# Patient Record
Sex: Male | Born: 1975 | Race: White | Hispanic: No | Marital: Married | Smoking: Current every day smoker
Health system: Southern US, Community
[De-identification: ages and names within clinical notes are randomized; demographics above are authoritative.]

## PROBLEM LIST (undated history)

## (undated) DIAGNOSIS — I10 Essential (primary) hypertension: Secondary | ICD-10-CM

## (undated) DIAGNOSIS — T7840XA Allergy, unspecified, initial encounter: Secondary | ICD-10-CM

## (undated) DIAGNOSIS — E785 Hyperlipidemia, unspecified: Secondary | ICD-10-CM

## (undated) DIAGNOSIS — R945 Abnormal results of liver function studies: Secondary | ICD-10-CM

## (undated) DIAGNOSIS — R7989 Other specified abnormal findings of blood chemistry: Secondary | ICD-10-CM

## (undated) HISTORY — DX: Hemochromatosis, unspecified: E83.119

## (undated) HISTORY — DX: Other specified abnormal findings of blood chemistry: R79.89

## (undated) HISTORY — DX: Hyperlipidemia, unspecified: E78.5

## (undated) HISTORY — DX: Essential (primary) hypertension: I10

## (undated) HISTORY — DX: Allergy, unspecified, initial encounter: T78.40XA

## (undated) HISTORY — DX: Abnormal results of liver function studies: R94.5

---

## 2006-12-14 ENCOUNTER — Ambulatory Visit: Payer: Self-pay | Admitting: Internal Medicine

## 2006-12-14 LAB — CONVERTED CEMR LAB
Albumin: 4.6 g/dL (ref 3.5–5.2)
Alkaline Phosphatase: 44 units/L (ref 39–117)
BUN: 12 mg/dL (ref 6–23)
Basophils Absolute: 0.1 10*3/uL (ref 0.0–0.1)
Bilirubin Urine: NEGATIVE
Calcium: 10.1 mg/dL (ref 8.4–10.5)
Creatinine, Ser: 0.9 mg/dL (ref 0.4–1.5)
Direct LDL: 182 mg/dL
HDL: 39.4 mg/dL (ref >39.0)
Hemoglobin, Urine: NEGATIVE
Hemoglobin: 16.1 g/dL (ref 13.0–17.0)
Ketones, ur: NEGATIVE mg/dL
LDL Cholesterol: 177 mg/dL — ABNORMAL HIGH (ref 0–99)
MCHC: 34.8 g/dL (ref 30.0–36.0)
Monocytes Absolute: 0.9 10*3/uL — ABNORMAL HIGH (ref 0.2–0.7)
Monocytes Relative: 10.5 % (ref 3.0–11.0)
Platelets: 301 10*3/uL (ref 150–400)
Potassium: 4.7 meq/L (ref 3.5–5.1)
RBC: 4.81 M/uL (ref 4.22–5.81)
RDW: 11.9 % (ref 11.5–14.6)
TSH: 0.91 microintl units/mL (ref 0.35–5.50)
Total Bilirubin: 1.1 mg/dL (ref 0.3–1.2)
Total CHOL/HDL Ratio: 5.9
Total Protein, Urine: NEGATIVE mg/dL
Triglycerides: 80 mg/dL (ref 0–149)
Urine Glucose: NEGATIVE mg/dL
VLDL: 16 mg/dL (ref 0–40)
pH: 7 (ref 5.0–8.0)

## 2006-12-20 ENCOUNTER — Ambulatory Visit: Payer: Self-pay | Admitting: Internal Medicine

## 2007-02-05 ENCOUNTER — Ambulatory Visit: Payer: Self-pay | Admitting: Internal Medicine

## 2007-02-05 LAB — CONVERTED CEMR LAB
ALT: 45 units/L (ref 0–53)
HDL: 42.7 mg/dL (ref >39.0)
Triglycerides: 120 mg/dL (ref 0–149)

## 2007-02-11 ENCOUNTER — Ambulatory Visit: Payer: Self-pay | Admitting: Internal Medicine

## 2007-04-05 ENCOUNTER — Ambulatory Visit: Payer: Self-pay | Admitting: Internal Medicine

## 2007-04-05 LAB — CONVERTED CEMR LAB
Cholesterol: 192 mg/dL (ref 0–200)
HDL: 46.2 mg/dL (ref >39.0)
LDL Cholesterol: 138 mg/dL — ABNORMAL HIGH (ref 0–99)
Total CHOL/HDL Ratio: 4.2

## 2007-04-12 ENCOUNTER — Ambulatory Visit: Payer: Self-pay | Admitting: Internal Medicine

## 2007-04-12 DIAGNOSIS — E785 Hyperlipidemia, unspecified: Secondary | ICD-10-CM | POA: Insufficient documentation

## 2007-04-12 DIAGNOSIS — I10 Essential (primary) hypertension: Secondary | ICD-10-CM | POA: Insufficient documentation

## 2007-10-15 ENCOUNTER — Ambulatory Visit: Payer: Self-pay | Admitting: Internal Medicine

## 2007-10-15 LAB — CONVERTED CEMR LAB
ALT: 23 units/L (ref 0–53)
AST: 29 units/L (ref 0–37)
LDL Cholesterol: 113 mg/dL — ABNORMAL HIGH (ref 0–99)
VLDL: 21 mg/dL (ref 0–40)

## 2007-10-18 ENCOUNTER — Ambulatory Visit: Payer: Self-pay | Admitting: Internal Medicine

## 2007-12-02 ENCOUNTER — Ambulatory Visit: Payer: Self-pay | Admitting: Internal Medicine

## 2008-03-10 ENCOUNTER — Ambulatory Visit: Payer: Self-pay | Admitting: Internal Medicine

## 2008-06-22 ENCOUNTER — Telehealth (INDEPENDENT_AMBULATORY_CARE_PROVIDER_SITE_OTHER): Payer: Self-pay | Admitting: *Deleted

## 2008-06-23 ENCOUNTER — Ambulatory Visit: Payer: Self-pay | Admitting: Radiology

## 2008-06-23 ENCOUNTER — Ambulatory Visit: Payer: Self-pay | Admitting: Internal Medicine

## 2008-06-23 ENCOUNTER — Ambulatory Visit (HOSPITAL_BASED_OUTPATIENT_CLINIC_OR_DEPARTMENT_OTHER): Admission: RE | Admit: 2008-06-23 | Discharge: 2008-06-23 | Payer: Self-pay | Admitting: Internal Medicine

## 2008-07-23 ENCOUNTER — Ambulatory Visit: Payer: Self-pay | Admitting: Internal Medicine

## 2008-07-23 LAB — CONVERTED CEMR LAB
Total CHOL/HDL Ratio: 5
VLDL: 14.8 mg/dL (ref 0.0–40.0)

## 2008-07-29 ENCOUNTER — Ambulatory Visit: Payer: Self-pay | Admitting: Internal Medicine

## 2008-09-10 ENCOUNTER — Ambulatory Visit: Payer: Self-pay | Admitting: Internal Medicine

## 2008-09-10 LAB — CONVERTED CEMR LAB
AST: 33 units/L (ref 0–37)
BUN: 9 mg/dL (ref 6–23)
Cholesterol: 166 mg/dL (ref 0–200)
GFR calc non Af Amer: 104.37 mL/min (ref >60)
HDL: 46 mg/dL (ref >39.00)
Potassium: 4.8 meq/L (ref 3.5–5.1)
Sodium: 140 meq/L (ref 135–145)
VLDL: 11 mg/dL (ref 0.0–40.0)

## 2008-09-14 ENCOUNTER — Ambulatory Visit: Payer: Self-pay | Admitting: Internal Medicine

## 2009-03-24 ENCOUNTER — Telehealth: Payer: Self-pay | Admitting: Internal Medicine

## 2009-08-02 ENCOUNTER — Ambulatory Visit: Payer: Self-pay | Admitting: Internal Medicine

## 2009-08-02 LAB — CONVERTED CEMR LAB
ALT: 25 units/L (ref 0–53)
AST: 33 units/L (ref 0–37)
Bilirubin, Direct: 0.1 mg/dL (ref 0.0–0.3)
CO2: 30 meq/L (ref 19–32)
Chloride: 103 meq/L (ref 96–112)
Creatinine, Ser: 0.9 mg/dL (ref 0.4–1.5)
Potassium: 4.8 meq/L (ref 3.5–5.1)
Total CHOL/HDL Ratio: 4
Total Protein: 7.5 g/dL (ref 6.0–8.3)
Triglycerides: 99 mg/dL (ref 0.0–149.0)

## 2009-08-05 ENCOUNTER — Ambulatory Visit: Payer: Self-pay | Admitting: Internal Medicine

## 2009-11-23 ENCOUNTER — Telehealth: Payer: Self-pay | Admitting: Internal Medicine

## 2009-11-24 ENCOUNTER — Ambulatory Visit: Payer: Self-pay | Admitting: Family

## 2010-05-25 NOTE — Progress Notes (Signed)
Summary: Head Congestion  Phone Note Call from Patient Call back at Home Phone [phone]   Caller: Patient Call For: D. Thomos Lemons DO Summary of Call: patient called and left voice message stating he has been nursing a sinus infection for the past few days and he is not any better. His message states that he has head congestion. He has taken over the counter medication with no relief. He states he will be leaving to go out of town and is requesting a antibiotic.   call was returned to patient, he was informed that Dr Artist Pais does not prescribe antibiotics without being seen. He states that he does not think that he will be able to get in to see Dr Artist Pais today, and he will be leaving to go out of town in the morning. He aske that I check with Dr Artist Pais to see what he advises Initial call taken by: Glendell Docker CMA,  November 23, 2009 9:14 AM  Follow-up for Phone Call        sorry, we can not prescribe abx over the phone Follow-up by: D. Thomos Lemons DO,  November 23, 2009 12:04 PM  Additional Follow-up for Phone Call Additional follow up Details #1::        call returned to patient, he was advised per Dr Artist Pais instructions. He has scheduled a follow up for 8/3 with Sandford Craze Additional Follow-up by: Glendell Docker CMA,  November 23, 2009 2:50 PM

## 2010-05-25 NOTE — Assessment & Plan Note (Signed)
Summary: SINUS INFECTION/DK--Rm 4   Vital Signs:  Patient profile:   35 year old male Height:      71 inches Weight:      161.75 pounds BMI:     22.64 O2 Sat:      99 % on Room air Temp:     98.3 degrees F 0 Pulse rate:   98 / minute Pulse rhythm:   regular Resp:     16 per minute BP sitting:   130 / 82  (right arm) Cuff size:   regular  Vitals Entered By: Mervin Kung CMA Duncan Dull) (November 24, 2009 9:43 AM)  O2 Flow:  Room air CC: Room 4   Sinus drainage, sneezing and head congestion x 4 days.  Pt also states that his left ear doesn't feel right since beach trip in May. Is Patient Diabetic? No   Primary Care Provider:  Dondra Spry DO  CC:  Room 4   Sinus drainage and sneezing and head congestion x 4 days.  Pt also states that his left ear doesn't feel right since beach trip in May..  History of Present Illness: Dave Rodriguez is a 35 year old male who presents with complaint of sinus drainage since sunday (4 days ago)  Notes that he has had yellow nasal discharge.  Denies pain with eye movement.  Denies fever.  + Frontal sinus pressure.  Energy level is good.  Denies cough.  Notes that his ears feel "clogged up"   Notes that he got some water in his left ear in May while at the beach, and "hasn't been the same since."  Allergies (verified): No Known Drug Allergies  Past History:  Past Medical History: Last updated: 08/05/2009 Hyperlipidemia Family history of CAD    Hypertension   Family History: Last updated: 08/05/2009 Family History of CAD Male 1st degree relative <50 Family History Hypertension Maternal grandmother was diagnosed with colon cancer in her mid 28s.    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 Former Smoker  Alcohol use-yes        Risk Factors: Alcohol Use: 2 (07/29/2008) Caffeine Use: 4 beverages daily (08/05/2009) Exercise: no (08/05/2009)  Risk Factors: Smoking Status: current  (08/05/2009) Packs/Day: <0.25 (07/29/2008)  Physical Exam  General:  Well-developed,well-nourished,in no acute distress; alert,appropriate and cooperative throughout examination Head:  Normocephalic and atraumatic without obvious abnormalities. No apparent alopecia or balding. Ears:  R TM normal.  L TM partially occluded by cerumen.  Area of TM that is visible appears pink Lungs:  Normal respiratory effort, chest expands symmetrically. Lungs are clear to auscultation, no crackles or wheezes. Heart:  Normal rate and regular rhythm. S1 and S2 normal without gallop, murmur, click, rub or other extra sounds.   Impression & Recommendations:  Problem # 1:  SINUSITIS (ICD-473.9) Assessment New Will treat with amoxicillin x 10 days.   Pt was instructed as noted in pt instructions.  Patient was also counseled on smoking cessation. His updated medication list for this problem includes:    Amoxicillin 500 Mg Caps (Amoxicillin) ..... One tablet by mouth three times a day x 10  Problem # 2:  OTITIS MEDIA, LEFT (ICD-382.9) Assessment: New Suspect mild left OM.  Amoxicillin as for #1. His updated medication list for this problem includes:    Amoxicillin 500 Mg Caps (Amoxicillin) ..... One tablet by mouth three times a day x 10  Complete Medication List: 1)  Hydrochlorothiazide  12.5 Mg Tabs (Hydrochlorothiazide) .... One by mouth qd 2)  Simvastatin 40 Mg Tabs (Simvastatin) .... One by mouth qpm 3)  Amitriptyline Hcl 25 Mg Tabs (Amitriptyline hcl) .... 1/2 to 1 tab by mouth at bedtime prn 4)  Amlodipine Besylate 2.5 Mg Tabs (Amlodipine besylate) .... One by mouth once daily 5)  Amoxicillin 500 Mg Caps (Amoxicillin) .... One tablet by mouth three times a day x 10  Other Orders: Tobacco use cessation intermediate 3-10 minutes ([address]08)  Patient Instructions: 1)  Call if your symptoms worsen or do not improve. Prescriptions: AMOXICILLIN 500 MG CAPS (AMOXICILLIN) one tablet by mouth three times a day  x 10  #30 x 0   Entered and Authorized by:   Lemont Fillers FNP   Signed by:   Lemont Fillers FNP on 11/24/2009   Method used:   Electronically to        [facility]. [address] [phone]* (retail)       [address]       [place], [place]  [address]       Ph: [phone] or [phone]       Fax: [phone]   RxID:   [id number]   Current Allergies (reviewed today): No known allergies   Prevention & Chronic Care Immunizations   Influenza vaccine: Not documented    Tetanus booster: 07/29/2008: Tdap    Pneumococcal vaccine: Not documented  Other Screening   Smoking status: current  (08/05/2009)  Lipids   Total Cholesterol: 191  (08/02/2009)   LDL: 123  (08/02/2009)   LDL Direct: 172.5  (07/23/2008)   HDL: 48.60  (08/02/2009)   Triglycerides: 99.0  (08/02/2009)    SGOT (AST): 33  (08/02/2009)   SGPT (ALT): 25  (08/02/2009)   Alkaline phosphatase: 47  (08/02/2009)   Total bilirubin: 0.8  (08/02/2009)  Hypertension   Last Blood Pressure: 130 / 82  (11/24/2009)   Serum creatinine: 0.9  (08/02/2009)   Serum potassium 4.8  (08/02/2009)  Self-Management Support :    Hypertension self-management support: Not documented    Lipid self-management support: Not documented

## 2010-05-25 NOTE — Assessment & Plan Note (Signed)
Summary: 6 MONTHS ROV-CH [facility] WITH PT/MHF, rescheduled- jr   Vital Signs:  Patient profile:   35 year old male Height:      71 inches Weight:      164 pounds BMI:     22.96 O2 Sat:      97 % on Room air Temp:     98.8 degrees F oral Pulse rate:   81 / minute Pulse rhythm:   regular Resp:     16 per minute BP sitting:   130 / 94  (right arm) Cuff size:   98large  Vitals Entered By: Glendell Docker CMA (August 05, 2009 2:08 PM)  O2 Flow:  Room air CC: Rm 2- 6 Month Follow up disease management   Primary Care Provider:  DThomos Lemons DO  CC:  Rm 2- 6 Month Follow up disease management.  History of Present Illness:  Hypertension Follow-Up      This is a 35 year old man who presents for Hypertension follow-up.  The patient denies lightheadedness, headaches, and edema.  The patient denies the following associated symptoms: chest pain.  Compliance with medications (by patient report) has been near 100%.  The patient reports that dietary compliance has been fair.  The patient reports exercising occasionally.    hyperlipidemia - tolerating meds.  good compliance  He plans on moving to [place], [place].  still working for [employer]  Alcohol-Tobacco     Smoking Status: current  Caffeine-Diet-Exercise     Caffeine use/day: 4 beverages daily     Does Patient Exercise: no  Allergies (verified): No Known Drug Allergies  Past History:  Past Medical History: Hyperlipidemia Family history of CAD    Hypertension   Family History: Family History of CAD Male 1st degree relative <50 Family History Hypertension Maternal grandmother was diagnosed with colon cancer in her mid 19s.    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 Former Smoker  Alcohol use-yes      Caffeine use/day:  4 beverages daily  Physical Exam  General:  alert, well-developed, and well-nourished.   Neck:  No deformities, masses, or  tenderness noted.no carotid bruits.   Lungs:  normal respiratory effort, normal breath sounds, no crackles, and no wheezes.   Heart:  Normal rate and regular rhythm. S1 and S2 normal without gallop, murmur, click, rub or other extra sounds.   Impression & Recommendations:  Problem # 1:  HYPERTENSION, BORDERLINE (ICD-401.9) diastolic bp is suboptimal.  add low dose amlodipine.  pt to monitor for lower ext edema  His updated medication list for this problem includes:    Hydrochlorothiazide 12.5 Mg Tabs (Hydrochlorothiazide) ..... One by mouth qd    Amlodipine Besylate 2.5 Mg Tabs (Amlodipine besylate) ..... One by mouth once daily  BP today: 130/94 Prior BP: 130/80 (09/14/2008)  Labs Reviewed: K+: 4.8 (08/02/2009) Creat: : 0.9 (08/02/2009)   Chol: 191 (08/02/2009)   HDL: 48.60 (08/02/2009)   LDL: 123 (08/02/2009)   TG: 99.0 (08/02/2009)  Problem # 2:  DYSLIPIDEMIA (ICD-272.4) Assessment: Unchanged stable.  Maintain current medication regimen.  His updated medication list for this problem includes:    Simvastatin 40 Mg Tabs (Simvastatin) ..... One by mouth qpm  Labs Reviewed: SGOT: 33 (08/02/2009)   SGPT: 25 (08/02/2009)   HDL:48.60 (08/02/2009), 46.00 (09/10/2008)  LDL:123 (08/02/2009), 109 (09/10/2008)  Chol:191 (08/02/2009), 166 (09/10/2008)  Trig:99.0 (08/02/2009), 55.0 (09/10/2008)  Complete Medication List: 1)  Hydrochlorothiazide 12.5  Mg Tabs (Hydrochlorothiazide) .... One by mouth qd 2)  Simvastatin 40 Mg Tabs (Simvastatin) .... One by mouth qpm 3)  Amitriptyline Hcl 25 Mg Tabs (Amitriptyline hcl) .... 1/2 to 1 tab by mouth at bedtime prn 4)  Amlodipine Besylate 2.5 Mg Tabs (Amlodipine besylate) .... One by mouth once daily Prescriptions: SIMVASTATIN 40 MG TABS (SIMVASTATIN) one by mouth qpm  #90 x 3   Entered and Authorized by:   D. Thomos Lemons DO   Signed by:   D. Thomos Lemons DO on 08/05/2009   Method used:   Electronically to        [facility]. [address] [phone]*  (retail)       [address]       [place], [place]  [address]       Ph: [phone] or [phone]       Fax: [phone]   RxID:   [id number] HYDROCHLOROTHIAZIDE 12.5 MG TABS (HYDROCHLOROTHIAZIDE) one by mouth qd  #90 x 3   Entered and Authorized by:   D. Thomos Lemons DO   Signed by:   D. Thomos Lemons DO on 08/05/2009   Method used:   Electronically to        [facility]. [address] [phone]* (retail)       [address]       [place], [place]  [address]       Ph: [phone] or [phone]       Fax: [phone]   RxID:   [id number] AMLODIPINE BESYLATE 2.5 MG TABS (AMLODIPINE BESYLATE) one by mouth once daily  #30 x 5   Entered and Authorized by:   D. Thomos Lemons DO   Signed by:   D. Thomos Lemons DO on 08/05/2009   Method used:   Electronically to        [facility]. [address] [phone]* (retail)       [address]       [place], [place]  [address]       Ph: [phone] or [phone]       Fax: [phone]   RxID:   [id number]     Current Allergies (reviewed today): No known allergies

## 2010-06-21 ENCOUNTER — Encounter (INDEPENDENT_AMBULATORY_CARE_PROVIDER_SITE_OTHER): Payer: Self-pay | Admitting: *Deleted

## 2010-06-21 ENCOUNTER — Other Ambulatory Visit: Payer: Self-pay | Admitting: Internal Medicine

## 2010-06-21 ENCOUNTER — Other Ambulatory Visit: Payer: 59

## 2010-06-21 DIAGNOSIS — E785 Hyperlipidemia, unspecified: Secondary | ICD-10-CM

## 2010-06-21 DIAGNOSIS — Z0000 Encounter for general adult medical examination without abnormal findings: Secondary | ICD-10-CM

## 2010-06-21 DIAGNOSIS — Z0389 Encounter for observation for other suspected diseases and conditions ruled out: Secondary | ICD-10-CM

## 2010-06-21 LAB — CBC WITH DIFFERENTIAL/PLATELET
Basophils Relative: 1 % (ref 0.0–3.0)
Eosinophils Absolute: 0.2 10*3/uL (ref 0.0–0.7)
Eosinophils Relative: 4.8 % (ref 0.0–5.0)
HCT: 45.5 % (ref 39.0–52.0)
Hemoglobin: 16.1 g/dL (ref 13.0–17.0)
Lymphs Abs: 1.4 10*3/uL (ref 0.7–4.0)
MCHC: 35.4 g/dL (ref 30.0–36.0)
MCV: 99.8 fl (ref 78.0–100.0)
Monocytes Absolute: 0.5 10*3/uL (ref 0.1–1.0)
Neutro Abs: 2.6 10*3/uL (ref 1.4–7.7)
RBC: 4.55 Mil/uL (ref 4.22–5.81)
WBC: 4.7 10*3/uL (ref 4.5–10.5)

## 2010-06-21 LAB — BASIC METABOLIC PANEL
CO2: 29 mEq/L (ref 19–32)
Chloride: 102 mEq/L (ref 96–112)
Creatinine, Ser: 0.7 mg/dL (ref 0.4–1.5)
Potassium: 5.4 mEq/L — ABNORMAL HIGH (ref 3.5–5.1)
Sodium: 139 mEq/L (ref 135–145)

## 2010-06-21 LAB — URINALYSIS
Bilirubin Urine: NEGATIVE
Ketones, ur: NEGATIVE
Leukocytes, UA: NEGATIVE
Nitrite: NEGATIVE
Specific Gravity, Urine: 1.01 (ref 1.000–1.030)
Total Protein, Urine: NEGATIVE
pH: 6.5 (ref 5.0–8.0)

## 2010-06-21 LAB — LDL CHOLESTEROL, DIRECT: Direct LDL: 145.4 mg/dL

## 2010-06-21 LAB — LIPID PANEL
Cholesterol: 204 mg/dL — ABNORMAL HIGH (ref 0–200)
Triglycerides: 46 mg/dL (ref 0.0–149.0)

## 2010-06-21 LAB — HEPATIC FUNCTION PANEL
ALT: 25 U/L (ref 0–53)
Albumin: 4.9 g/dL (ref 3.5–5.2)
Total Protein: 7.3 g/dL (ref 6.0–8.3)

## 2010-06-28 ENCOUNTER — Encounter: Payer: Self-pay | Admitting: Internal Medicine

## 2010-06-28 ENCOUNTER — Encounter (INDEPENDENT_AMBULATORY_CARE_PROVIDER_SITE_OTHER): Payer: 59 | Admitting: Internal Medicine

## 2010-06-28 DIAGNOSIS — L538 Other specified erythematous conditions: Secondary | ICD-10-CM | POA: Insufficient documentation

## 2010-06-28 DIAGNOSIS — Z0000 Encounter for general adult medical examination without abnormal findings: Secondary | ICD-10-CM

## 2010-06-28 LAB — CONVERTED CEMR LAB

## 2010-07-05 NOTE — Assessment & Plan Note (Signed)
Summary: CPE/MHF   Vital Signs:  Patient profile:   35 year old male Height:      71 inches Weight:      167 pounds BMI:     23.38 O2 Sat:      100 % on Room air Temp:     98.5 degrees F oral Pulse rate:   74 / minute Resp:     18 per minute BP sitting:   132 / 84  (right arm) Cuff size:   regular  Vitals Entered By: Glendell Docker CMA (June 28, 2010 8:20 AM)  O2 Flow:  Room air  Primary Care Provider:  D. Thomos Lemons DO   History of Present Illness: 35 year old white male for routine physical He has history of mild hypertension and hyperlipidemia Patient previously prescribed simvastatin for strong family history of coronary artery disease CRP has been elevated in the past  It has been several months since patient has taken any of his medications He noticed mild ED symptoms when we added amlodipine to hydrochlorothiazide Patient has been more compliant with healthier diet His blood pressure has been fairly normal at home he also notes less stress at home  He states fairly active He denies unusual chest pain or shortness of breath  we reviewed his recent blood work He has mildly elevated AST He takes Advil occasionally for headaches Episodic alcohol use  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 to the [place] office but company decided not to relocate him  Son is now 58 years old, but will be 2 in June Stable home life  Preventive Screening-Counseling & Management  Alcohol-Tobacco     Alcohol drinks/day: <1     Smoking Status: current  Caffeine-Diet-Exercise     Caffeine use/day: 3 beverages daily     Does Patient Exercise: no  Allergies (verified): No Known Drug Allergies  Past History:  Past Medical History: Hyperlipidemia Family history of CAD    Hypertension      Family History: Family History of CAD Male 1st degree relative <50 Family History Hypertension Maternal grandmother was diagnosed with colon  cancer in her mid 64s.     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         no hernias, c/o rash on top of buttocks, mild pruritus,  assoc with sweating  Physical Exam  General:  alert, well-developed, and well-nourished.   Head:  normocephalic and atraumatic.   Neck:  No deformities, masses, or tenderness noted. Lungs:  normal respiratory effort and normal breath sounds.   Heart:  normal rate, regular rhythm, and no gallop.   Abdomen:  soft, non-tender, normal bowel sounds, no masses, no hepatomegaly, and no splenomegaly.   Extremities:  No lower extremity edema Neurologic:  cranial nerves II-XII intact and gait normal.   Skin:  1 cm oval shaped hyperpigmented lesion middle of back Psych:  normally interactive, good eye contact, not anxious appearing, and not depressed appearing.     Impression & Recommendations:  Problem # 1:  HEALTH MAINTENANCE EXAM (ICD-V70.0) Reviewed adult health maintenance protocols. Pt counseled on diet and exercise. decrease alcohol use  Td Booster: Tdap (07/29/2008)  Flu Vax: Declined (06/28/2010)   Chol: 204 (06/21/2010)   HDL: 50.90 (06/21/2010)   LDL: 123 (08/02/2009)   TG: 46.0 (06/21/2010) TSH: 0.97 (06/21/2010)   PSA: 0.46 (06/21/2010)  Problem # 2:  HYPERTENSION, BORDERLINE (ICD-401.9) Assessment: Improved  Pt stopped both meds  BP has been fairly stable amlodipine caused mild ED encouraged regular exercise program   The following medications were removed from the medication list:    Hydrochlorothiazide 12.5 Mg Tabs (Hydrochlorothiazide) ..... One by mouth qd    Amlodipine Besylate 2.5 Mg Tabs (Amlodipine besylate) ..... One by mouth once daily  BP  today: 132/84 Prior BP: 130/82 (11/24/2009)  Labs Reviewed: K+: 5.4 (06/21/2010) Creat: : 0.7 (06/21/2010)   Chol: 204 (06/21/2010)   HDL: 50.90 (06/21/2010)   LDL: 123 (08/02/2009)   TG: 46.0 (06/21/2010)  Problem # 3:  INTERTRIGO, CANDIDAL (ICD-695.89) pt with erythematous patch top of buttocks near crease probable candial intertrigo use lotrisone as directed keep area dry  Complete Medication List: 1)  Simvastatin 10 Mg Tabs (Simvastatin) .... One by mouth qpm 2)  Clotrimazole-betamethasone 1-0.05 % Crea (Clotrimazole-betamethasone) .... Apply once daily x 2 weeks as directed  Patient Instructions: 1)  Please schedule a follow-up appointment in 1 year 2)  BMP prior to visit, ICD-9:  272.4 3)  Hepatic Panel prior to visit, ICD-9: 272.4 4)  Lipid Panel prior to visit, ICD-9: 272.4 5)  High sensitivity CRP :  272.4 6)  Please return for lab work one (1) week before your next appointment.  7)  Please follow up with your dermatologist (Dr. Mayford Knife) to have skin lesion on back biopsied Prescriptions: CLOTRIMAZOLE-BETAMETHASONE 1-0.05 % CREA (CLOTRIMAZOLE-BETAMETHASONE) apply once daily x 2 weeks as directed  #15 grams x 0   Entered and Authorized by:   D. Thomos Lemons DO   Signed by:   D. Thomos Lemons DO on 06/28/2010   Method used:   Electronically to        [facility]. [address] [phone]* (retail)       [address]       [place], [place]  [address]       Ph: [phone] or [phone]       Fax: [phone]   RxID:   [id number] SIMVASTATIN 10 MG TABS (SIMVASTATIN) one by mouth qpm  #90 x 1   Entered and Authorized by:   D. Thomos Lemons DO   Signed by:   D. Thomos Lemons DO on 06/28/2010   Method used:   Electronically to        [facility]. [address] [phone]* (retail)       [address]       [place], [place]  [address]       Ph: [phone] or [phone]       Fax: [phone]   RxID:   [id number]    Orders Added: 1)  Est.  Patient 18-39 years [[id number]]   Immunization History:  Influenza Immunization History:    Influenza:  declined (06/28/2010)   Contraindications/Deferment of Procedures/Staging:    Test/Procedure: PSA    Reason for deferment: patient declined   Immunization History:  Influenza Immunization History:    Influenza:  Declined (06/28/2010)   Current Allergies (reviewed today): No known allergies

## 2010-09-06 NOTE — Assessment & Plan Note (Signed)
[facility]                           PRIMARY CARE OFFICE NOTE   NAME:Dave Rodriguez, Dave Rodriguez                        MRN:          [id number]  DATE:12/20/2006                            DOB:          18-Dec-1975    CHIEF COMPLAINT:  New patient to practice.   HISTORY OF PRESENT ILLNESS:  Patient is a 35 year old white male here to  establish primary care.  He previously lived in [place] but  returned two years ago.  He has been fairly healthy for most of his  life.  He notes history of allergic rhinitis.  He was seen by a [facility]  allergist 6 to 8 years ago.  He received allergy shots for 2 or 3 years.  His allergy symptoms have significantly improved since then.  He does  have a history of mild wheezing when he is exposed to cat dander.   CURRENT MEDICATIONS:  None.   ALLERGY TO MEDICATIONS:  None.   SOCIAL HISTORY:  Patient has been married for the last 6 years.  He has  a 24-month-old son.  He works in Airline pilot for [employer].   FAMILY HISTORY:  1. Mother and father are both 60.  2. Father had his first MI at age 72, noted to have high blood      pressure and high cholesterol.  3. Mother is fairly healthy.  4. Maternal grandfather has mild diabetes.  He is 49 years old.  5. Maternal grandmother was diagnosed with colon cancer in her mid      36's.   HABITS:  He averages 10-14 drinks per week.  He is a social drinker.  He  smokes also socially 2 to 3 cigarettes per day.   LABORATORY DATA:  Patient had screening labs including CBC notable for  an H&H of 16.1 and 46.4, basic metabolic profile showed glucose  minimally elevated at 102, BUN 12, serum creatinine at 0.9, LFTs were  unremarkable.  Lipid panel showed a total cholesterol of 232,  triglycerides 80, HDL 177, LDL of 182, TSH was 0.91 and UA was normal.   REVIEW OF SYSTEMS:  Occasional nasal congestion.  He takes over-the-  counter Claritin as needed.  Denies any history of  palpitations, chest  pain, dyspnea on exertion.  He does not exercise on a regular basis.  Denies heartburn, nausea, vomiting, constipation, diarrhea.  No dark  stools or blood in his stool.  All other systems negative.   PHYSICAL EXAM:  VITALS:  Height is 5 feet 11 inches, weight is 183.8  pounds, temperature is 98.6, pulse is 70, BP is 150/90 in the left arm  in the seated position.  GENERAL:  The patient is a pleasant, well-developed, well-nourished 31-  year-old white male in no apparent distress.  HEENT:  Normocephalic, atraumatic.  Pupils were equal and reactive to  light bilaterally.  Extraocular motility was intact.  Patient was  anicteric, conjunctiva was within normal limits.  OROPHARYNGEAL EXAM:  Benign.  Normal dentition.  NECK:  Supple.  No adenopathy, carotid bruit or thyromegaly.  CHEST EXAM:  Normal  inspiratory effort.  Chest is clear to auscultation  bilaterally, no rhonchi, rales or wheezing.  CARDIOVASCULAR:  Regular rate and rhythm.  No significant murmurs, rubs  or gallops appreciated.  ABDOMEN:  Soft, nontender, positive bowel sounds.  No organomegaly.  MUSCULOSKELETAL EXAM:  No clubbing, cyanosis or edema.  Patient has  palpable pedis, dorsalis and tibialis posterior pulses.  NEUROLOGIC:  Cranial nerve II through XII was grossly intact.  He was  nonfocal.  SKIN EXAM:  The patient had a small plaque-like eruption over his right  hand and also his upper chest and abdomen and a 1 cm nevus on his back.   A baseline EKG in the office showed normal sinus rhythm at 52 bpm, no ST  changes and minimal voltage criteria for LVH, may be normal variant.   IMPRESSION/RECOMMENDATIONS:  Routine physical in 35 year old white male  with strong family history of coronary disease.   Patient hesitant to take statin type medication.  We discussed low  cholesterol diet.  He is to monitor his blood pressure at home and  decrease his salt intake.  We discussed the importance of  aerobic  exercise with a formula for target heart rate.  We will repeat his lipid  panel, check a C-reactive protein and Apo B in approximately 6 weeks.  Patient will try triamcinolone cream 0.1% b.i.d. for probable eczema.  This could be pityriasis rosea.   Lastly, patient will schedule a separate appointment for punch biopsy of  dysplastic nevus.   Follow up in 7 to 8 weeks.     Barbette Hair. Artist Pais, DO  Electronically Signed    RDY/MedQ  DD: 12/20/2006  DT: 12/21/2006  Job #: [id number]

## 2011-11-10 ENCOUNTER — Other Ambulatory Visit (INDEPENDENT_AMBULATORY_CARE_PROVIDER_SITE_OTHER): Payer: 59

## 2011-11-10 DIAGNOSIS — Z0000 Encounter for general adult medical examination without abnormal findings: Secondary | ICD-10-CM

## 2011-11-10 LAB — HEPATIC FUNCTION PANEL
ALT: 40 U/L (ref 0–53)
AST: 39 U/L — ABNORMAL HIGH (ref 0–37)
Albumin: 4.6 g/dL (ref 3.5–5.2)
Alkaline Phosphatase: 44 U/L (ref 39–117)
Bilirubin, Direct: 0 mg/dL (ref 0.0–0.3)
Total Protein: 7 g/dL (ref 6.0–8.3)

## 2011-11-10 LAB — CBC WITH DIFFERENTIAL/PLATELET
Basophils Relative: 0.7 % (ref 0.0–3.0)
Eosinophils Relative: 2.5 % (ref 0.0–5.0)
Hemoglobin: 15.5 g/dL (ref 13.0–17.0)
Lymphocytes Relative: 28.3 % (ref 12.0–46.0)
MCHC: 34.4 g/dL (ref 30.0–36.0)
Monocytes Relative: 11.1 % (ref 3.0–12.0)
Neutro Abs: 3.6 10*3/uL (ref 1.4–7.7)
Neutrophils Relative %: 57.4 % (ref 43.0–77.0)
RBC: 4.52 Mil/uL (ref 4.22–5.81)
WBC: 6.3 10*3/uL (ref 4.5–10.5)

## 2011-11-10 LAB — TSH: TSH: 1.31 u[IU]/mL (ref 0.35–5.50)

## 2011-11-10 LAB — POCT URINALYSIS DIPSTICK
Bilirubin, UA: NEGATIVE
Glucose, UA: NEGATIVE
Ketones, UA: NEGATIVE
Leukocytes, UA: NEGATIVE
Nitrite, UA: NEGATIVE
pH, UA: 7.5

## 2011-11-10 LAB — BASIC METABOLIC PANEL
CO2: 27 mEq/L (ref 19–32)
Calcium: 9.4 mg/dL (ref 8.4–10.5)
Creatinine, Ser: 0.9 mg/dL (ref 0.4–1.5)
Sodium: 140 mEq/L (ref 135–145)

## 2011-11-10 LAB — LIPID PANEL
HDL: 47.4 mg/dL (ref >39.00)
Total CHOL/HDL Ratio: 4

## 2011-11-14 ENCOUNTER — Ambulatory Visit (INDEPENDENT_AMBULATORY_CARE_PROVIDER_SITE_OTHER): Payer: 59 | Admitting: Internal Medicine

## 2011-11-14 ENCOUNTER — Encounter: Payer: Self-pay | Admitting: *Deleted

## 2011-11-14 VITALS — BP 132/84 | HR 82 | Temp 98.1°F | Resp 16 | Ht 71.0 in | Wt 167.0 lb

## 2011-11-14 DIAGNOSIS — Z0000 Encounter for general adult medical examination without abnormal findings: Secondary | ICD-10-CM | POA: Insufficient documentation

## 2011-11-14 NOTE — Progress Notes (Signed)
  Subjective:    Patient ID: Dave Rodriguez, male    DOB: 12-23-1975, 36 y.o.   MRN: [id number]  HPI  36 year old white male with history of dyslipidemia and borderline hypertension for routine physical. He denies any significant interval medical history. He's been doing fairly well. At last visit he reported he might get transferred to [facility] office. However this did not occur and he is still living in [place]. Unfortunately he went through a divorce 1 year ago.  We reviewed his recent fasting lipid profile. His results reflect patient being off of statins   Review of Systems   Constitutional: Negative for activity change, appetite change and unexpected weight change.  Eyes: Negative for visual disturbance.  Respiratory: Negative for cough, chest tightness and shortness of breath.   Cardiovascular: Negative for chest pain.  Genitourinary: Negative for difficulty urinating.  Neurological: Negative for headaches.  Gastrointestinal: Negative for abdominal pain, heartburn melena or hematochezia Psych: Negative for depression or anxiety  Past Medical History  Diagnosis Date  . Hyperlipidemia   . Hypertension     History   Social History  . Marital Status: Divorced    Spouse Name: N/A    Number of Children: N/A  . Years of Education: N/A   Occupational History  . Not on file.   Social History Main Topics  . Smoking status: Former Games developer  . Smokeless tobacco: Not on file  . Alcohol Use: Yes  . Drug Use: No  . Sexually Active: Not on file   Other Topics Concern  . Not on file   Social History Narrative  . No narrative on file    No past surgical history on file.  Family History  Problem Relation Age of Onset  . Coronary artery disease    . Hypertension    . Cancer Maternal Grandmother     Allergies not on file  No current outpatient prescriptions on file prior to visit.    BP 132/84  Pulse 82  Temp 98.1 F (36.7 C)  Resp 16  Ht 5\' 11"  (1.803 m)   Wt 167 lb (75.751 kg)  BMI 23.29 kg/m2     Objective:   Physical Exam  Constitutional: He is oriented to person, place, and time. He appears well-developed and well-nourished. No distress.  HENT:  Head: Normocephalic and atraumatic.  Right Ear: External ear normal.  Left Ear: External ear normal.  Mouth/Throat: Oropharynx is clear and moist.  Eyes: EOM are normal. No scleral icterus.  Neck: Neck supple.  Cardiovascular: Normal rate, regular rhythm and normal heart sounds.   Pulmonary/Chest: Effort normal and breath sounds normal. He has no wheezes. He has no rales.  Abdominal: Soft. Bowel sounds are normal. He exhibits no mass. There is no tenderness.  Genitourinary: Penis normal.  Musculoskeletal: He exhibits no edema.  Lymphadenopathy:    He has no cervical adenopathy.  Neurological: He is alert and oriented to person, place, and time.  Skin: Skin is warm and dry.  Psychiatric: He has a normal mood and affect. His behavior is normal.       Assessment & Plan:

## 2011-11-14 NOTE — Patient Instructions (Signed)
Please complete the following lab tests before your next follow up appointment: BMET, LFTs, Lipid panel, HsCRP - 272.4

## 2011-11-14 NOTE — Assessment & Plan Note (Signed)
Reviewed adult health maintenance protocols.  Lipid panel is acceptable off of statins. I encouraged low saturated fat diet and regular exercise.  Patient also advised to monitor blood pressure at home.

## 2012-12-20 ENCOUNTER — Other Ambulatory Visit: Payer: 59

## 2012-12-24 ENCOUNTER — Other Ambulatory Visit (INDEPENDENT_AMBULATORY_CARE_PROVIDER_SITE_OTHER): Payer: 59

## 2012-12-24 DIAGNOSIS — Z0000 Encounter for general adult medical examination without abnormal findings: Secondary | ICD-10-CM

## 2012-12-24 LAB — POCT URINALYSIS DIPSTICK
Blood, UA: NEGATIVE
Nitrite, UA: NEGATIVE
Urobilinogen, UA: 1
pH, UA: 6

## 2012-12-24 LAB — CBC WITH DIFFERENTIAL/PLATELET
Basophils Absolute: 0 10*3/uL (ref 0.0–0.1)
Basophils Relative: 0.7 % (ref 0.0–3.0)
Eosinophils Absolute: 0.2 10*3/uL (ref 0.0–0.7)
Lymphocytes Relative: 29.1 % (ref 12.0–46.0)
MCHC: 34.9 g/dL (ref 30.0–36.0)
Monocytes Relative: 11.2 % (ref 3.0–12.0)
Neutrophils Relative %: 56.6 % (ref 43.0–77.0)
RBC: 4.65 Mil/uL (ref 4.22–5.81)

## 2012-12-24 LAB — LIPID PANEL
Cholesterol: 247 mg/dL — ABNORMAL HIGH (ref 0–200)
Total CHOL/HDL Ratio: 4
Triglycerides: 71 mg/dL (ref 0.0–149.0)
VLDL: 14.2 mg/dL (ref 0.0–40.0)

## 2012-12-24 LAB — BASIC METABOLIC PANEL
CO2: 27 mEq/L (ref 19–32)
Chloride: 105 mEq/L (ref 96–112)
Potassium: 4.5 mEq/L (ref 3.5–5.1)

## 2012-12-24 LAB — HEPATIC FUNCTION PANEL
AST: 98 U/L — ABNORMAL HIGH (ref 0–37)
Alkaline Phosphatase: 42 U/L (ref 39–117)
Bilirubin, Direct: 0.1 mg/dL (ref 0.0–0.3)

## 2012-12-26 ENCOUNTER — Encounter: Payer: Self-pay | Admitting: Internal Medicine

## 2012-12-26 ENCOUNTER — Ambulatory Visit (INDEPENDENT_AMBULATORY_CARE_PROVIDER_SITE_OTHER): Payer: 59 | Admitting: Internal Medicine

## 2012-12-26 ENCOUNTER — Ambulatory Visit: Payer: 59

## 2012-12-26 VITALS — BP 140/90 | HR 68 | Temp 98.2°F | Ht 69.5 in | Wt 174.0 lb

## 2012-12-26 DIAGNOSIS — E785 Hyperlipidemia, unspecified: Secondary | ICD-10-CM

## 2012-12-26 DIAGNOSIS — D239 Other benign neoplasm of skin, unspecified: Secondary | ICD-10-CM

## 2012-12-26 DIAGNOSIS — I10 Essential (primary) hypertension: Secondary | ICD-10-CM

## 2012-12-26 DIAGNOSIS — Z0000 Encounter for general adult medical examination without abnormal findings: Secondary | ICD-10-CM

## 2012-12-26 MED ORDER — BENAZEPRIL HCL 5 MG PO TABS: 5.0000 mg | ORAL_TABLET | Freq: Every day | ORAL | Status: DC

## 2012-12-26 NOTE — Patient Instructions (Addendum)
Reduce your alcohol intake as directed Avoid NSAIDs (advil, motrin, aleve, etc) Follow low saturated fat diet.  Increase intake of soluble fiber.  Try to follow Mediterranean diet Please complete the following lab tests in 6 weeks: LFTs - 790.4 BMET - 401.9

## 2012-12-26 NOTE — Progress Notes (Signed)
Subjective:    Patient ID: Dave Rodriguez, male    DOB: [birth year], 37 y.o.   MRN: [id number]  HPI  37 year old white male with history of dyslipidemia and borderline hypertension for routine physical. Since previous visit he has remarried. He stays physically active. No significant change in his diet. We reviewed his blood work. He has elevation in his transaminases. His AST elevated at 98 and ALT at 73. He denies any use of over-the-counter herbal supplements. He uses Advil 600 mg every 3-4 days for occasional headache. He does admit to alcohol consumption. He averages 2-3 alcoholic beverages per day.  Patient previously on low-dose ACE inhibitor for mild hypertension. We discontinued when his blood pressures normalized with exercise. Patient reports he occasionally checks his blood pressure as outpatient. Systolic blood pressure readings are in the low 140s and 90s diastolic  Review of Systems     Constitutional: Negative for activity change, appetite change and unexpected weight change.  Eyes: Negative for visual disturbance.  Respiratory: Negative for cough, chest tightness and shortness of breath.   Cardiovascular: Negative for chest pain.  Genitourinary: Negative for difficulty urinating.  Neurological: Negative for headaches.  Gastrointestinal: Negative for abdominal pain, heartburn melena or hematochezia Psych: Negative for depression or anxiety Endo:  Negative for sexual dysfunction    Past Medical History  Diagnosis Date  . Hyperlipidemia   . Hypertension     History   Social History  . Marital Status: Divorced    Spouse Name: N/A    Number of Children: N/A  . Years of Education: N/A   Occupational History  . Not on file.   Social History Main Topics  . Smoking status: Former Games developer  . Smokeless tobacco: Not on file  . Alcohol Use: Yes  . Drug Use: No  . Sexual Activity: Not on file   Other Topics Concern  . Not on file   Social History Narrative  . No  narrative on file    No past surgical history on file.  Family History  Problem Relation Age of Onset  . Coronary artery disease    . Hypertension    . Cancer Maternal Grandmother     No Known Allergies  No current outpatient prescriptions on file prior to visit.   No current facility-administered medications on file prior to visit.    BP 140/90  Pulse 68  Temp(Src) 98.2 F (36.8 C) (Oral)  Ht 5' 9.5" (1.765 m)  Wt 174 lb (78.926 kg)  BMI 25.34 kg/m2     Objective:   Physical Exam  Constitutional: He is oriented to person, place, and time. He appears well-developed and well-nourished. No distress.  HENT:  Head: Normocephalic and atraumatic.  Right Ear: External ear normal.  Left Ear: External ear normal.  Mouth/Throat: Oropharynx is clear and moist.  Eyes: Conjunctivae and EOM are normal. Pupils are equal, round, and reactive to light.  Neck: Neck supple.  No carotid bruit  Cardiovascular: Normal rate, regular rhythm and normal heart sounds.   No murmur heard. Pulmonary/Chest: Effort normal and breath sounds normal. He has no wheezes.  Abdominal: Soft. Bowel sounds are normal. He exhibits no mass. There is no tenderness.  No hepatosplenomegaly  Musculoskeletal: He exhibits no edema.  Lymphadenopathy:    He has no cervical adenopathy.  Neurological: He is alert and oriented to person, place, and time. No cranial nerve deficit.  Skin: Skin is warm and dry.  Hyperpigmented lesion mid back  Psychiatric: He has a  normal mood and affect. His behavior is normal.          Assessment & Plan:

## 2012-12-26 NOTE — Assessment & Plan Note (Signed)
Restart low-dose ACE inhibitor. Start on Benazepril 5 mg once daily. Arrange followup electrolytes and kidney function. Patient advised to avoid NSAIDs if possible.  Decrease alcohol consumption.

## 2012-12-26 NOTE — Assessment & Plan Note (Signed)
Dietary changes for now.  He was previously on statin.  Check HS CRP.

## 2012-12-26 NOTE — Assessment & Plan Note (Signed)
Reviewed adult health maintenance protocols.  Patient encouraged to decrease alcohol use. He declines influenza vaccine. I suspect elevated liver enzymes secondary to alcohol use. Plan to repeat LFTs in 2 months after decreasing alcohol use.  If persistent elevation, we discussed ruling out chronic hepatitis, fatty liver, and hemochromatosis.  Lipids are elevated.  I recommended dietary measures.

## 2013-02-11 ENCOUNTER — Other Ambulatory Visit: Payer: 59

## 2013-02-19 ENCOUNTER — Other Ambulatory Visit (INDEPENDENT_AMBULATORY_CARE_PROVIDER_SITE_OTHER): Payer: 59

## 2013-02-19 DIAGNOSIS — I10 Essential (primary) hypertension: Secondary | ICD-10-CM

## 2013-02-19 LAB — HEPATIC FUNCTION PANEL
ALT: 150 U/L — ABNORMAL HIGH (ref 0–53)
AST: 182 U/L — ABNORMAL HIGH (ref 0–37)
Albumin: 4.8 g/dL (ref 3.5–5.2)
Alkaline Phosphatase: 49 U/L (ref 39–117)

## 2013-02-19 LAB — BASIC METABOLIC PANEL
BUN: 7 mg/dL (ref 6–23)
Chloride: 99 mEq/L (ref 96–112)
GFR: 105.65 mL/min (ref >60.00)
Glucose, Bld: 112 mg/dL — ABNORMAL HIGH (ref 70–99)
Potassium: 3.8 mEq/L (ref 3.5–5.1)
Sodium: 138 mEq/L (ref 135–145)

## 2013-02-25 ENCOUNTER — Ambulatory Visit: Payer: 59 | Admitting: Internal Medicine

## 2013-02-26 ENCOUNTER — Ambulatory Visit (INDEPENDENT_AMBULATORY_CARE_PROVIDER_SITE_OTHER): Payer: 59 | Admitting: Internal Medicine

## 2013-02-26 ENCOUNTER — Encounter: Payer: Self-pay | Admitting: Internal Medicine

## 2013-02-26 VITALS — BP 140/100 | Temp 98.6°F | Wt 174.0 lb

## 2013-02-26 DIAGNOSIS — F1010 Alcohol abuse, uncomplicated: Secondary | ICD-10-CM | POA: Insufficient documentation

## 2013-02-26 DIAGNOSIS — F17200 Nicotine dependence, unspecified, uncomplicated: Secondary | ICD-10-CM

## 2013-02-26 DIAGNOSIS — R7401 Elevation of levels of liver transaminase levels: Secondary | ICD-10-CM

## 2013-02-26 MED ORDER — LORAZEPAM 1 MG PO TABS: 1.0000 mg | ORAL_TABLET | Freq: Two times a day (BID) | ORAL | Status: DC | PRN

## 2013-02-26 MED ORDER — CITALOPRAM HYDROBROMIDE 10 MG PO TABS: 10.0000 mg | ORAL_TABLET | Freq: Every day | ORAL | Status: DC

## 2013-02-26 NOTE — Progress Notes (Signed)
  Subjective:    Patient ID: Dave Rodriguez, male    DOB: 09-Oct-1975, 37 y.o.   MRN: [id number]  HPI  37 year white male with hx of hyperlipidemia, hypertension and abnormal LFTs for follow up.  Patient reports he decreased his alcohol consumption but he is still drinking 25-30 drinks per week.  He denies any OTC medication use.  He denies family hx of chronic liver disease.    He started drinking heavier during his first marriage. It has gradually increased over last 1-2 years.  He also reports interpersonal relationship issues with his mother.    He denies depressive symptoms but has some anxiety / stress issues.  Htn - stable  Review of Systems Negative for weight change,  No jaundice  Past Medical History  Diagnosis Date  . Hyperlipidemia   . Hypertension     History   Social History  . Marital Status: Divorced    Spouse Name: N/A    Number of Children: N/A  . Years of Education: N/A   Occupational History  . Not on file.   Social History Main Topics  . Smoking status: Former Games developer  . Smokeless tobacco: Not on file  . Alcohol Use: Yes  . Drug Use: No  . Sexual Activity: Not on file   Other Topics Concern  . Not on file   Social History Narrative  . No narrative on file    No past surgical history on file.  Family History  Problem Relation Age of Onset  . Coronary artery disease    . Hypertension    . Cancer Maternal Grandmother     No Known Allergies  Current Outpatient Prescriptions on File Prior to Visit  Medication Sig Dispense Refill  . benazepril (LOTENSIN) 5 MG tablet Take 1 tablet (5 mg total) by mouth daily.  90 tablet  3   No current facility-administered medications on file prior to visit.    BP 140/100  Temp(Src) 98.6 F (37 C) (Oral)  Wt 174 lb (78.926 kg)       Objective:   Physical Exam  Constitutional: He is oriented to person, place, and time. He appears well-developed and well-nourished. No distress.  HENT:  Head:  Normocephalic and atraumatic.  Eyes: Conjunctivae and EOM are normal. Pupils are equal, round, and reactive to light. No scleral icterus.  Neck: Neck supple.  Cardiovascular: Normal rate, regular rhythm and normal heart sounds.   No murmur heard. Pulmonary/Chest: Effort normal and breath sounds normal. He has no wheezes.  Abdominal: Soft. Bowel sounds are normal.  Mild hepatosplenomegaly  Musculoskeletal: He exhibits no edema.  Lymphadenopathy:    He has no cervical adenopathy.  Neurological: He is alert and oriented to person, place, and time. No cranial nerve deficit.  Psychiatric: He has a normal mood and affect. His behavior is normal.          Assessment & Plan:

## 2013-02-26 NOTE — Assessment & Plan Note (Signed)
Patient agrees to taper alcohol consumption.   Use lorazepam 1 mg bid for possible withdrawal symptoms.  He may have underlying anxiety disorder.  Trial of low dose citalopram.  He declines referral to behavioral health for now.

## 2013-02-26 NOTE — Assessment & Plan Note (Signed)
We discussed working towards tobacco cessation after treatment for alcohol use.

## 2013-02-26 NOTE — Assessment & Plan Note (Signed)
Patient has moderately elevated LFTs presumed secondary to alcohol use.  Rule out chronic liver disease.  Obtain liver ultrasound.

## 2013-02-27 LAB — HEPATITIS C ANTIBODY: HCV Ab: NEGATIVE

## 2013-02-27 LAB — IRON AND TIBC
%SAT: 55 % (ref 20–55)
TIBC: 320 ug/dL (ref 215–435)
UIBC: 144 ug/dL (ref 125–400)

## 2013-02-27 LAB — CK: Total CK: 123 U/L (ref 7–232)

## 2013-02-27 LAB — HEPATITIS B SURFACE ANTIGEN: Hepatitis B Surface Ag: NEGATIVE

## 2013-02-27 LAB — HIV ANTIBODY (ROUTINE TESTING W REFLEX): HIV: NONREACTIVE

## 2013-02-27 NOTE — Progress Notes (Signed)
Quick Note:  Per Dave Rodriguez- pt has to return for more bloodwork. Left a vm for pt advising pt to return . ______

## 2013-02-28 LAB — ALDOLASE: Aldolase: 16 U/L — ABNORMAL HIGH (ref <=8.1)

## 2013-03-01 LAB — CERULOPLASMIN: Ceruloplasmin: 35 mg/dL (ref 20–60)

## 2013-03-03 ENCOUNTER — Other Ambulatory Visit: Payer: 59

## 2013-03-03 ENCOUNTER — Ambulatory Visit: Admission: RE | Admit: 2013-03-03 | Payer: 59 | Source: Ambulatory Visit

## 2013-03-03 ENCOUNTER — Other Ambulatory Visit: Payer: Self-pay | Admitting: Internal Medicine

## 2013-03-03 ENCOUNTER — Ambulatory Visit
Admission: RE | Admit: 2013-03-03 | Discharge: 2013-03-03 | Disposition: A | Discharge status: Home / Self Care | Payer: 59 | Source: Ambulatory Visit | Attending: Internal Medicine | Admitting: Internal Medicine

## 2013-03-03 DIAGNOSIS — R7401 Elevation of levels of liver transaminase levels: Secondary | ICD-10-CM

## 2013-03-03 DIAGNOSIS — F1010 Alcohol abuse, uncomplicated: Secondary | ICD-10-CM

## 2013-03-03 LAB — PROTEIN ELECTROPHORESIS, SERUM
Albumin ELP: 68.8 % — ABNORMAL HIGH (ref 55.8–66.1)
Alpha-1-Globulin: 4 % (ref 2.9–4.9)
Alpha-2-Globulin: 9.9 % (ref 7.1–11.8)
Gamma Globulin: 7.8 % — ABNORMAL LOW (ref 11.1–18.8)

## 2013-03-04 ENCOUNTER — Encounter: Payer: Self-pay | Admitting: Internal Medicine

## 2013-03-05 LAB — HEMOCHROMATOSIS DNA-PCR(C282Y,H63D)

## 2013-03-06 ENCOUNTER — Encounter: Payer: Self-pay | Admitting: Internal Medicine

## 2013-03-06 ENCOUNTER — Other Ambulatory Visit: Payer: Self-pay | Admitting: Internal Medicine

## 2013-03-10 ENCOUNTER — Telehealth: Payer: Self-pay | Admitting: Internal Medicine

## 2013-03-10 NOTE — Telephone Encounter (Signed)
Pt states dr Artist Pais wants him to come back in or some sort of inj? pls call pt when you find out what it is.

## 2013-03-11 NOTE — Telephone Encounter (Signed)
Left message for pt to call back  °

## 2013-03-11 NOTE — Telephone Encounter (Signed)
Patient needs to be set up for twinrix (Hep A and B) vaccines.

## 2013-03-12 NOTE — Telephone Encounter (Signed)
Pt notified via mychart

## 2013-03-13 ENCOUNTER — Encounter: Payer: Self-pay | Admitting: *Deleted

## 2013-03-13 ENCOUNTER — Encounter: Payer: Self-pay | Admitting: Gastroenterology

## 2013-03-14 ENCOUNTER — Ambulatory Visit (INDEPENDENT_AMBULATORY_CARE_PROVIDER_SITE_OTHER): Payer: 59 | Admitting: *Deleted

## 2013-03-14 DIAGNOSIS — Z23 Encounter for immunization: Secondary | ICD-10-CM

## 2013-03-18 ENCOUNTER — Ambulatory Visit (INDEPENDENT_AMBULATORY_CARE_PROVIDER_SITE_OTHER): Payer: 59 | Admitting: Gastroenterology

## 2013-03-18 ENCOUNTER — Encounter: Payer: Self-pay | Admitting: Gastroenterology

## 2013-03-18 DIAGNOSIS — F1010 Alcohol abuse, uncomplicated: Secondary | ICD-10-CM

## 2013-03-18 DIAGNOSIS — R7401 Elevation of levels of liver transaminase levels: Secondary | ICD-10-CM

## 2013-03-18 NOTE — Patient Instructions (Signed)
Please follow up in one year or sooner if symptoms reoccur  Information on Hemochromatosis is below for your review  ________________________________________________________________________________________________________________________________________________  Hemochromatosis You have hemochromatosis. This is when you have too much iron in your body. Iron is necessary in your body for many reasons. Iron works in the hemoglobin (a protein in red blood cells) which carries oxygen to your body. However, too much iron can lead to long-term health problems. When the iron builds up in the body it affects the liver, heart, and pancreas. This can lead to heart failure, diabetes, cirrhosis, cancer, impotence, and depression. Fatigue is a common complaint. Additional symptoms include headache, shortness of breath, heart irregularities, and tanning skin. The most common cause of this disease is hereditary (passed from parents). Another cause is a diet that contains too much iron. This can happen to people who eat a lot of red meat, take supplemental iron, take vitamin C, and drink alcohol. Frequent transfusions may also lead to iron overload. DIAGNOSIS  Three simple blood tests will usually make the diagnosis of hemochromatosis.  A transferrin iron saturation percentage test will measure your ability to circulate iron.  A ferritin test will measure the amount of iron you have in storage.  A hemoglobin test will measure how much iron is circulating in your blood. HOME CARE INSTRUCTIONS  Follow the diet provided for you by your caregiver. The recommended daily allowance for iron is:  15 mg per day for women ages 72 to 65.  30 mg per day for women who are pregnant or lactating.  10 mg per day for men and women ages 51 and older. It is important to follow your caregiver's instructions because you can lead a normal life with treatment. If left untreated, this disease can be life-threatening. MAKE SURE YOU:    Understand these instructions.  Will watch your condition.  Will get help right away if you are not doing well or get worse. Document Released: 04/07/2000 Document Revised: 07/03/2011 Document Reviewed: 04/10/2005 [facility] Patient Information 2014 [place], [place].

## 2013-03-18 NOTE — Progress Notes (Signed)
History of Present Illness:  This is a 37 year old white male who recently was found  have abnormal liver function tests on routine screening.  SGOT was 182 and SGPT 150.  Liver function tests otherwise normal.  Iron levels showed 55% saturation with a serum ferritin of 595.  Genetic assay showed that the patient is a homozygote for H63D genenes.  Patient has no symptomatology whatsoever.  He specifically denies right upper quadrant pain, history of hepatitis, diabetes, any cardiovascular symptoms, joint pains, skin rashes, general malaise and fatigue.  He's never had previous extensive GI evaluations.  He relates she's had normal blood work for the last 6 years.  There is diabetes in his family, father has coronary artery disease, and his grandmother died of colon cancer.  Patient does use ethanol heavily drinking 20-30 alcoholic drinks a week.  He relates however that this is not a problem for him he has not had social consequences from drinking.  His wife is with him today and corroborates his story.  He has not had liver biopsy.  Review of his recent ultrasound shows no abnormalities.  CBC is normal.  He is on medicine for hypertension.  I have reviewed this patient's present history, medical and surgical past history, allergies and medications.     ROS:   All systems were reviewed and are negative unless otherwise stated in the HPI.  No Known Allergies Outpatient Prescriptions Prior to Visit  Medication Sig Dispense Refill  . benazepril (LOTENSIN) 5 MG tablet Take 1 tablet (5 mg total) by mouth daily.  90 tablet  3  . citalopram (CELEXA) 10 MG tablet Take 1 tablet (10 mg total) by mouth daily.  30 tablet  1  . LORazepam (ATIVAN) 1 MG tablet Take 1 tablet (1 mg total) by mouth 2 (two) times daily as needed for anxiety.  30 tablet  0   No facility-administered medications prior to visit.   Past Medical History  Diagnosis Date  . Hyperlipidemia   . Hypertension   . Elevated LFTs    History  reviewed. No pertinent past surgical history. History   Social History  . Marital Status: Divorced    Spouse Name: N/A    Number of Children: 3  . Years of Education: N/A   Occupational History  .  [employer]   Social History Main Topics  . Smoking status: Current Every Day Smoker -- 0.50 packs/day for 2.5 years    Types: Cigarettes  . Smokeless tobacco: Never Used  . Alcohol Use: 12.5 - 15 oz/week    25-30 drink(s) per week  . Drug Use: No  . Sexual Activity: None   Other Topics Concern  . None   Social History Narrative  . None   Family History  Problem Relation Age of Onset  . Coronary artery disease    . Hypertension    . Colon cancer Maternal Grandmother 50       Physical Exam: Blood pressure 142/94, pulse 87 and regular and weight 175 with a BMI of 25.48. General well developed well nourished patient in no acute distress, appearing their stated age Eyes PERRLA, no icterus, fundoscopic exam per opthamologist Skin no lesions noted Neck supple, no adenopathy, no thyroid enlargement, no tenderness Chest clear to percussion and auscultation Heart no significant murmurs, gallops or rubs noted Abdomen no hepatosplenomegaly masses or tenderness, BS normal.  Extremities no acute joint lesions, edema, phlebitis or evidence of cellulitis. Neurologic patient oriented x 3, cranial nerves intact, no  focal neurologic deficits noted. Psychological mental status normal and normal affect.  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  This patient probably has some involvement of his liver with iron overload, but this would be hard to determine with his level of alcohol intake.  I've strongly advise he and his wife that the patient try to discontinue alcohol if possible by whatever means available.  For his hemachromatosis, he needs to undergo phlebotomy every other week with serum ferritin level and CBC every 3  months.  With his ferritin level reaches is 50-100, we can back off of his phlebotomies to every month or 2 depending on his lab data.The plan is to remove iron but not to produce significant symptomatic anemia.  Also while he is undergoing phlebotomy, we can follow his liver function tests.  He was to have these procedures done at his primary care physician's office, and I will forward this letter to primary care.  I recommended this patient undergo screening colonoscopy at age 64.  It appears that he is been properly immunized.  Whether or not he will need professional counseling with his alcohol problem will evolve over time.   CC Dr Marylynn Pearson Artist Pais

## 2013-04-01 ENCOUNTER — Encounter: Payer: Self-pay | Admitting: Internal Medicine

## 2013-04-03 ENCOUNTER — Telehealth: Payer: Self-pay | Admitting: Internal Medicine

## 2013-04-03 DIAGNOSIS — R7401 Elevation of levels of liver transaminase levels: Secondary | ICD-10-CM

## 2013-04-03 DIAGNOSIS — F1010 Alcohol abuse, uncomplicated: Secondary | ICD-10-CM

## 2013-04-03 NOTE — Telephone Encounter (Addendum)
Dr Jarold Motto, pt states you wanted him to come here for labs.  I reviewed your note and I don't see anything documented on what you want drawn.  Could you review chart and let me know?  I can contact patient.  Thanks

## 2013-04-03 NOTE — Telephone Encounter (Signed)
Patient Information:  Caller Name: [name]  Phone: [phone]  Patient: Dave Rodriguez, Dave Rodriguez  Gender: Male  DOB: 1975/05/12  Age: 37 Years  PCP: Artist Pais Doe-Hyun Molly Maduro) (Adults only)  Office Follow Up:  Does the office need to follow up with this patient?: Yes  Instructions For The Office: Please contact patient regarding request for medication and request about lab draws.  RN Note:  Patient was told he would be able to have blood drawn for testing regarding Hemochromatosis in this office. He would like to know if that can be scheduled and also is requesting an antibiotic for his sinus infection symptoms. Please contact patient to give further instructions.  Symptoms  Reason For Call & Symptoms: Reports sinus pain/pressure with drainage down the back of the throat. Requesting antibiotic to help with this instead of coming into the office.  Reviewed Health History In EMR: Yes  Reviewed Medications In EMR: Yes  Reviewed Allergies In EMR: Yes  Reviewed Surgeries / Procedures: Yes  Date of Onset of Symptoms: 04/01/2013  Treatments Tried: Advil Cold and Sinus  Treatments Tried Worked: No  Guideline(s) Used:  Sinus Pain and Congestion  Disposition Per Guideline:   Home Care  Reason For Disposition Reached:   Sinus congestion as part of a cold, present < 10 days  Advice Given:  N/A  Patient Refused Recommendation:  Patient Requests Prescription  Requesting prescription for antibiotic.

## 2013-04-03 NOTE — Telephone Encounter (Signed)
Can't call in abx without examining pt.  Call Dr. Norval Gable office re: which labs he wants drawn and how often.

## 2013-04-04 NOTE — Telephone Encounter (Signed)
Ferritin,CBC and liver profile

## 2013-04-08 ENCOUNTER — Ambulatory Visit (INDEPENDENT_AMBULATORY_CARE_PROVIDER_SITE_OTHER): Payer: 59 | Admitting: Family Medicine

## 2013-04-08 ENCOUNTER — Telehealth: Payer: Self-pay | Admitting: Gastroenterology

## 2013-04-08 ENCOUNTER — Other Ambulatory Visit (INDEPENDENT_AMBULATORY_CARE_PROVIDER_SITE_OTHER): Payer: 59

## 2013-04-08 ENCOUNTER — Encounter: Payer: Self-pay | Admitting: Family Medicine

## 2013-04-08 ENCOUNTER — Other Ambulatory Visit: Payer: 59

## 2013-04-08 VITALS — BP 140/100 | Temp 98.4°F | Wt 175.0 lb

## 2013-04-08 DIAGNOSIS — F1010 Alcohol abuse, uncomplicated: Secondary | ICD-10-CM

## 2013-04-08 DIAGNOSIS — R7401 Elevation of levels of liver transaminase levels: Secondary | ICD-10-CM

## 2013-04-08 DIAGNOSIS — J111 Influenza due to unidentified influenza virus with other respiratory manifestations: Secondary | ICD-10-CM

## 2013-04-08 LAB — CBC WITH DIFFERENTIAL/PLATELET
Basophils Relative: 0.4 % (ref 0.0–3.0)
Eosinophils Relative: 0.2 % (ref 0.0–5.0)
Hemoglobin: 15.4 g/dL (ref 13.0–17.0)
Lymphocytes Relative: 18.4 % (ref 12.0–46.0)
MCHC: 34.1 g/dL (ref 30.0–36.0)
MCV: 101.7 fl — ABNORMAL HIGH (ref 78.0–100.0)
Monocytes Relative: 8.6 % (ref 3.0–12.0)
Neutro Abs: 7 10*3/uL (ref 1.4–7.7)
Neutrophils Relative %: 72.4 % (ref 43.0–77.0)
RBC: 4.43 Mil/uL (ref 4.22–5.81)
WBC: 9.7 10*3/uL (ref 4.5–10.5)

## 2013-04-08 MED ORDER — AZITHROMYCIN 250 MG PO TABS: ORAL_TABLET | ORAL | Status: DC

## 2013-04-08 MED ORDER — ALBUTEROL SULFATE HFA 108 (90 BASE) MCG/ACT IN AERS: 2.0000 | INHALATION_SPRAY | Freq: Four times a day (QID) | RESPIRATORY_TRACT | Status: DC | PRN

## 2013-04-08 NOTE — Progress Notes (Signed)
No chief complaint on file.   HPI:  -started: about 1 week ago, both kids had the flu (+ test) now somewhat better but setting in lungs with cough -symptoms:nasal congestion, sore throat, cough, sinus pain, chest congestion -denies:fever, SOB, NVD, tooth pain, wheezing -has tried: advil cold and sinus -sick contacts/travel/risks: denies flu exposure, tick exposure or or Ebola risks -Hx of: allergies  ROS: See pertinent positives and negatives per HPI.  Past Medical History  Diagnosis Date  . Hyperlipidemia   . Hypertension   . Elevated LFTs     No past surgical history on file.  Family History  Problem Relation Age of Onset  . Coronary artery disease    . Hypertension    . Colon cancer Maternal Grandmother 61    History   Social History  . Marital Status: Divorced    Spouse Name: N/A    Number of Children: 3  . Years of Education: N/A   Occupational History  .  [employer]   Social History Main Topics  . Smoking status: Current Every Day Smoker -- 0.50 packs/day for 2.5 years    Types: Cigarettes  . Smokeless tobacco: Never Used  . Alcohol Use: 12.5 - 15 oz/week    25-30 drink(s) per week  . Drug Use: No  . Sexual Activity: None   Other Topics Concern  . None   Social History Narrative  . None    Current outpatient prescriptions:albuterol (PROVENTIL HFA;VENTOLIN HFA) 108 (90 BASE) MCG/ACT inhaler, Inhale 2 puffs into the lungs every 6 (six) hours as needed for wheezing or shortness of breath., Disp: 1 Inhaler, Rfl: 1;  azithromycin (ZITHROMAX) 250 MG tablet, 2 tabs on first day then 1 tab daily for 4 days, Disp: 6 tablet, Rfl: 0;  benazepril (LOTENSIN) 5 MG tablet, Take 1 tablet (5 mg total) by mouth daily., Disp: 90 tablet, Rfl: 3 citalopram (CELEXA) 10 MG tablet, Take 1 tablet (10 mg total) by mouth daily., Disp: 30 tablet, Rfl: 1;  LORazepam (ATIVAN) 1 MG tablet, Take 1 tablet (1 mg total) by mouth 2 (two) times daily as needed for anxiety., Disp: 30  tablet, Rfl: 0  EXAM:  Filed Vitals:   04/08/13 1459  BP: 140/100  Temp: 98.4 F (36.9 C)    Body mass index is 25.48 kg/(m^2).  GENERAL: vitals reviewed and listed above, alert, oriented, appears well hydrated and in no acute distress  HEENT: atraumatic, conjunttiva clear, no obvious abnormalities on inspection of external nose and ears, normal appearance of ear canals and TMs, clear nasal congestion, mild post oropharyngeal erythema with PND, no tonsillar edema or exudate, no sinus TTP  NECK: no obvious masses on inspection  LUNGS: few difuse wheezes  CV: HRRR, no peripheral edema  MS: moves all extremities without noticeable abnormality  PSYCH: pleasant and cooperative, no obvious depression or anxiety  ASSESSMENT AND PLAN:  Discussed the following assessment and plan:  Influenza with respiratory manifestations - Plan: azithromycin (ZITHROMAX) 250 MG tablet, albuterol (PROVENTIL HFA;VENTOLIN HFA) 108 (90 BASE) MCG/ACT inhaler  -given HPI and exam findings today, a serious infection or illness is unlikely but he may have had the flu with possible bronchitis or mild pneumonia. We discussed treatment side effects, likely course, antibiotic misuse, transmission, and signs of developing a serious illness. -of course, we advised to return or notify a doctor immediately if symptoms worsen or persist or new concerns arise.    There are no Patient Instructions on file for this visit.  Dave Benton R.

## 2013-04-08 NOTE — Progress Notes (Signed)
Pre visit review using our clinic review tool, if applicable. No additional management support is needed unless otherwise documented below in the visit note. 

## 2013-04-08 NOTE — Telephone Encounter (Signed)
Labs have been drawn.

## 2013-04-09 LAB — FERRITIN: Ferritin: 584.1 ng/mL — ABNORMAL HIGH (ref 22.0–322.0)

## 2013-04-09 LAB — HEPATIC FUNCTION PANEL
ALT: 127 U/L — ABNORMAL HIGH (ref 0–53)
AST: 141 U/L — ABNORMAL HIGH (ref 0–37)
Alkaline Phosphatase: 59 U/L (ref 39–117)
Bilirubin, Direct: 0.1 mg/dL (ref 0.0–0.3)
Total Bilirubin: 0.6 mg/dL (ref 0.3–1.2)

## 2013-04-14 ENCOUNTER — Ambulatory Visit (INDEPENDENT_AMBULATORY_CARE_PROVIDER_SITE_OTHER): Payer: 59

## 2013-04-14 DIAGNOSIS — Z23 Encounter for immunization: Secondary | ICD-10-CM

## 2013-04-14 NOTE — Telephone Encounter (Signed)
Message copied by Florene Glen on Mon Apr 14, 2013 10:25 AM ------      Message from: Jarold Motto, DAVID R      Created: Mon Apr 14, 2013  8:24 AM       Weekly ferritin for 3 mos and then check ferritin      ----- Message -----         From: SYSTEM         Sent: 04/13/2013  12:09 AM           To: Mardella Layman, MD                   ------

## 2013-04-22 ENCOUNTER — Other Ambulatory Visit (INDEPENDENT_AMBULATORY_CARE_PROVIDER_SITE_OTHER): Payer: 59

## 2013-05-06 ENCOUNTER — Other Ambulatory Visit (INDEPENDENT_AMBULATORY_CARE_PROVIDER_SITE_OTHER): Payer: 59

## 2013-05-08 ENCOUNTER — Telehealth: Payer: Self-pay | Admitting: *Deleted

## 2013-05-08 NOTE — Telephone Encounter (Signed)
Message copied by Pearletha Forge on Thu May 08, 2013 10:46 AM ------      Message from: Rosine Abe      Created: Thu May 08, 2013 10:44 AM       Middle of February 2015.                  ----- Message -----         From: Scarlette Calico, CMA         Sent: 05/06/2013   2:14 PM           To: Rosine Abe, DO            Pt has last phlebotomy draw on 05/21/13.  How long after that do you want to recheck ferritin, cbcd, and hepatic?       ------

## 2013-05-08 NOTE — Telephone Encounter (Signed)
Left message on pts cell phone

## 2013-05-20 ENCOUNTER — Other Ambulatory Visit (INDEPENDENT_AMBULATORY_CARE_PROVIDER_SITE_OTHER): Payer: 59

## 2013-05-21 ENCOUNTER — Other Ambulatory Visit: Payer: 59

## 2013-06-10 ENCOUNTER — Other Ambulatory Visit: Payer: 59

## 2013-06-26 ENCOUNTER — Other Ambulatory Visit (INDEPENDENT_AMBULATORY_CARE_PROVIDER_SITE_OTHER): Payer: 59

## 2013-06-26 LAB — CBC WITH DIFFERENTIAL/PLATELET
Basophils Absolute: 0.1 10*3/uL (ref 0.0–0.1)
Basophils Relative: 0.6 % (ref 0.0–3.0)
EOS ABS: 0.1 10*3/uL (ref 0.0–0.7)
Eosinophils Relative: 0.7 % (ref 0.0–5.0)
HCT: 45.7 % (ref 39.0–52.0)
HEMOGLOBIN: 15.4 g/dL (ref 13.0–17.0)
LYMPHS PCT: 15.8 % (ref 12.0–46.0)
Lymphs Abs: 1.8 10*3/uL (ref 0.7–4.0)
MCHC: 33.7 g/dL (ref 30.0–36.0)
MCV: 102.5 fl — ABNORMAL HIGH (ref 78.0–100.0)
Monocytes Absolute: 0.9 10*3/uL (ref 0.1–1.0)
Monocytes Relative: 8.1 % (ref 3.0–12.0)
NEUTROS PCT: 74.8 % (ref 43.0–77.0)
Neutro Abs: 8.6 10*3/uL — ABNORMAL HIGH (ref 1.4–7.7)
Platelets: 305 10*3/uL (ref 150.0–400.0)
RBC: 4.46 Mil/uL (ref 4.22–5.81)
RDW: 12.8 % (ref 11.5–14.6)
WBC: 11.5 10*3/uL — AB (ref 4.5–10.5)

## 2013-06-26 LAB — HEPATIC FUNCTION PANEL
ALT: 122 U/L — ABNORMAL HIGH (ref 0–53)
AST: 170 U/L — AB (ref 0–37)
Albumin: 4.6 g/dL (ref 3.5–5.2)
Alkaline Phosphatase: 54 U/L (ref 39–117)
BILIRUBIN DIRECT: 0 mg/dL (ref 0.0–0.3)
TOTAL PROTEIN: 7.3 g/dL (ref 6.0–8.3)
Total Bilirubin: 0.6 mg/dL (ref 0.3–1.2)

## 2013-06-26 LAB — FERRITIN: Ferritin: 52.7 ng/mL (ref 22.0–322.0)

## 2013-07-08 ENCOUNTER — Encounter: Payer: Self-pay | Admitting: Internal Medicine

## 2013-07-11 NOTE — Telephone Encounter (Signed)
Message copied by Pearletha Forge on Fri Jul 11, 2013  8:29 AM ------      Message from: Alyson Ingles R      Created: Wed Jul 09, 2013 12:45 PM       Can you find out from GI who is taking this patient as Dr. Sharlett Iles is retiring?            I would like to talk with one of the GI physicians today re: Denton Meek hemochromatosis and elevated LFTs.            RY ------

## 2013-07-15 ENCOUNTER — Ambulatory Visit: Payer: 59 | Admitting: Gastroenterology

## 2013-07-18 ENCOUNTER — Ambulatory Visit (INDEPENDENT_AMBULATORY_CARE_PROVIDER_SITE_OTHER): Payer: 59 | Admitting: Gastroenterology

## 2013-07-18 ENCOUNTER — Other Ambulatory Visit (INDEPENDENT_AMBULATORY_CARE_PROVIDER_SITE_OTHER): Payer: 59

## 2013-07-18 ENCOUNTER — Encounter: Payer: Self-pay | Admitting: Gastroenterology

## 2013-07-18 DIAGNOSIS — R7989 Other specified abnormal findings of blood chemistry: Secondary | ICD-10-CM | POA: Insufficient documentation

## 2013-07-18 DIAGNOSIS — R945 Abnormal results of liver function studies: Secondary | ICD-10-CM

## 2013-07-18 DIAGNOSIS — F1010 Alcohol abuse, uncomplicated: Secondary | ICD-10-CM

## 2013-07-18 LAB — FERRITIN: Ferritin: 43.1 ng/mL (ref 22.0–322.0)

## 2013-07-18 NOTE — Progress Notes (Signed)
i agree with the above plan

## 2013-07-18 NOTE — Progress Notes (Signed)
07/18/2013 Dave Rodriguez [id number] 09/28/1975   History of Present Illness:  This is a 14 or old male who was previously seen by Dr. Sharlett Iles for treatment of hemochromatosis.  He was diagnosed with this after having abnormal liver function tests on routine screening. Initially AST was 182 and ALT 150. Alkaline phosphatase and total bili were normal. Iron levels showed 55% saturation and serum ferritin of 595. Genetic assay showed that the patient is a homozygote for H63D genes.  Patient denies absolutely any complaints.  He was scheduled for phlebotomies and received his first phlebotomy on 05/06/2013. He underwent phlebotomy every other week for a total of 4 phlebotomies, with the last being around February 24. His last ferritin was drawn approximately 3 weeks ago and was normal at 52.7.  Hemoglobin was stable at 15.4 g.  LFTs still remained elevated with an AST of 170 and ALT 122. He was previously drinking a significant amount of alcohol with approximately 20-30 drinks per week. He says that he has not quit drinking but has cut back to approximately half of what he had been drinking previously. He is in the process of getting his hepatitis A and hepatitis B vaccination and has only one left in the series.  Current Medications, Allergies, Past Medical History, Past Surgical History, Family History and Social History were reviewed in [employer] record.   Physical Exam: BP 130/90  Pulse 84  Ht 5\' 9"  (1.753 m)  Wt 179 lb 8 oz (81.421 kg)  BMI 26.50 kg/m2 General: Well developed white male in no acute distress Head: Normocephalic and atraumatic Eyes:  Sclerae anicteric, conjunctiva pink  Ears: Normal auditory acuity Lungs: Clear throughout to auscultation Heart: Regular rate and rhythm Abdomen: Soft, non-distended.  Normal bowel sounds.  Non-tender. Musculoskeletal: Symmetrical with no gross deformities  Extremities: No edema  Neurological: Alert oriented x 4, grossly  non-focal Psychological:  Alert and cooperative. Normal mood and affect  Assessment and Recommendations: -Hemachromatosis:  Homozygote for genetic hemochromatosis.  Initially had a ferritin around 600.  Had phlebotomy x 4 with good response and most recent ferritin 52.7.  Dr. Shawna Orleans would like our office to take over monitoring his levels and setting up phlebotomies. The patient still prefers to get phlebotomies performed at [facility] where he has done them previously. He is in the process of getting his hepatitis A&B injections; has one more left.  We will recheck a ferritin level today. We'll schedule him to followup with Dr. Ardis Hughs in 6-8 weeks with a repeat ferritin, CBC, and CMP one day prior to his visit. -ETOH abuse:  He says he has decreased his intake to about half of what he was drinking previously. Advised him that he needs to significantly further decrease his consumption to approximately 7 drinks per week, average one drink per day due to the risk of cirrhosis and liver cancer.

## 2013-07-18 NOTE — Patient Instructions (Addendum)
You have been given a separate informational sheet regarding your tobacco use, the importance of quitting and local resources to help you quit.  Your physician has requested that you go to the basement for the following lab work before leaving today: Ferritin   You have a follow up visit with Dr Ardis Hughs on 09-09-13 at 345 pm Please come on 09-08-2013 for lab work. Lab is open Monday-Friday 730 am to 530 pm.

## 2013-08-18 ENCOUNTER — Ambulatory Visit (INDEPENDENT_AMBULATORY_CARE_PROVIDER_SITE_OTHER): Payer: 59

## 2013-08-18 DIAGNOSIS — Z23 Encounter for immunization: Secondary | ICD-10-CM

## 2013-09-09 ENCOUNTER — Other Ambulatory Visit (INDEPENDENT_AMBULATORY_CARE_PROVIDER_SITE_OTHER): Payer: 59

## 2013-09-09 ENCOUNTER — Ambulatory Visit (INDEPENDENT_AMBULATORY_CARE_PROVIDER_SITE_OTHER): Payer: 59 | Admitting: Gastroenterology

## 2013-09-09 ENCOUNTER — Encounter: Payer: Self-pay | Admitting: Gastroenterology

## 2013-09-09 LAB — CBC WITH DIFFERENTIAL/PLATELET
BASOS PCT: 0.7 % (ref 0.0–3.0)
Basophils Absolute: 0.1 10*3/uL (ref 0.0–0.1)
EOS PCT: 0.5 % (ref 0.0–5.0)
Eosinophils Absolute: 0 10*3/uL (ref 0.0–0.7)
HCT: 44.3 % (ref 39.0–52.0)
Hemoglobin: 14.9 g/dL (ref 13.0–17.0)
LYMPHS ABS: 1.9 10*3/uL (ref 0.7–4.0)
LYMPHS PCT: 22.2 % (ref 12.0–46.0)
MCHC: 33.7 g/dL (ref 30.0–36.0)
MCV: 101.2 fl — ABNORMAL HIGH (ref 78.0–100.0)
MONO ABS: 0.9 10*3/uL (ref 0.1–1.0)
Monocytes Relative: 10.8 % (ref 3.0–12.0)
NEUTROS PCT: 65.8 % (ref 43.0–77.0)
Neutro Abs: 5.7 10*3/uL (ref 1.4–7.7)
PLATELETS: 259 10*3/uL (ref 150.0–400.0)
RBC: 4.38 Mil/uL (ref 4.22–5.81)
RDW: 14.9 % (ref 11.5–15.5)
WBC: 8.7 10*3/uL (ref 4.0–10.5)

## 2013-09-09 LAB — COMPREHENSIVE METABOLIC PANEL
ALBUMIN: 4.8 g/dL (ref 3.5–5.2)
ALK PHOS: 57 U/L (ref 39–117)
ALT: 145 U/L — ABNORMAL HIGH (ref 0–53)
AST: 198 U/L — ABNORMAL HIGH (ref 0–37)
BILIRUBIN TOTAL: 0.7 mg/dL (ref 0.2–1.2)
BUN: 6 mg/dL (ref 6–23)
CO2: 25 mEq/L (ref 19–32)
Calcium: 9.5 mg/dL (ref 8.4–10.5)
Chloride: 101 mEq/L (ref 96–112)
Creatinine, Ser: 0.8 mg/dL (ref 0.4–1.5)
GFR: 109.67 mL/min (ref >60.00)
GLUCOSE: 79 mg/dL (ref 70–99)
POTASSIUM: 4.3 meq/L (ref 3.5–5.1)
Sodium: 137 mEq/L (ref 135–145)
Total Protein: 7.3 g/dL (ref 6.0–8.3)

## 2013-09-09 LAB — FERRITIN: FERRITIN: 148.7 ng/mL (ref 22.0–322.0)

## 2013-09-09 NOTE — Progress Notes (Signed)
Review of pertinent gastrointestinal problems: 1. previously seen by Dr. Sharlett Iles for treatment of hemochromatosis.2014 LFTs. Initially AST was 182 and ALT 150.Iron levels showed 55% saturation and serum ferritin of 595. Genetic assay showed that the patient is a homozygote for H63D genes. Hereceived his first phlebotomy on 05/06/2013. He underwent phlebotomy every other week for a total of 3 phlebotomies, with the last being around February 24. His last ferritin was drawn approximately 3 weeks ago and was normal at 52.7. Hemoglobin was stable at 15.4 g. LFTs still remained elevated with an AST of 170 and ALT 122. He was previously drinking a significant amount of alcohol with approximately 20-30 drinks per week. He says that he has not quit drinking but has cut back to approximately half of what he had been drinking previously. He is in the process of getting his hepatitis A and hepatitis B vaccination and has only one left in the series  Most recent liver imaging: [place] 02/2013 was normal  Most recent AFP level: not done yet  Hepatitis A/B vaccination: completed in 2015   HPI: This is a   very pleasant 38 year old man whom I am meeting for the first time. He was formally a patient of Dr. Buel Ream.  He feels very well, he had labs checked today, summarized below.   Labs 08/2013: Cbc macrocytic, normal Hb.  Ferritin level 150, Transaminases 150-190    Past Medical History  Diagnosis Date  . Hyperlipidemia   . Hypertension   . Elevated LFTs   . Hemochromatosis     History reviewed. No pertinent past surgical history.  Current Outpatient Prescriptions  Medication Sig Dispense Refill  . albuterol (PROVENTIL HFA;VENTOLIN HFA) 108 (90 BASE) MCG/ACT inhaler Inhale 2 puffs into the lungs every 6 (six) hours as needed for wheezing or shortness of breath.  1 Inhaler  1  . benazepril (LOTENSIN) 5 MG tablet Take 1 tablet (5 mg total) by mouth daily.  90 tablet  3  . citalopram (CELEXA) 10 MG  tablet Take 1 tablet (10 mg total) by mouth daily.  30 tablet  1  . LORazepam (ATIVAN) 1 MG tablet Take 1 tablet (1 mg total) by mouth 2 (two) times daily as needed for anxiety.  30 tablet  0   No current facility-administered medications for this visit.    Allergies as of 09/09/2013  . (No Known Allergies)    Family History  Problem Relation Age of Onset  . Coronary artery disease    . Hypertension    . Colon cancer Maternal Grandmother 24    History   Social History  . Marital Status: Divorced    Spouse Name: N/A    Number of Children: 3  . Years of Education: N/A   Occupational History  .  [employer]   Social History Main Topics  . Smoking status: Current Every Day Smoker -- 0.50 packs/day for 2.5 years    Types: Cigarettes  . Smokeless tobacco: Never Used  . Alcohol Use: 12.5 - 15.0 oz/week    25-30 drink(s) per week  . Drug Use: No  . Sexual Activity: Not on file   Other Topics Concern  . Not on file   Social History Narrative  . No narrative on file      Physical Exam: BP 158/98  Pulse 84  Ht 5\' 9"  (1.753 m)  Wt 174 lb 9.6 oz (79.198 kg)  BMI 25.77 kg/m2 Constitutional: generally well-appearing Psychiatric: alert and oriented x3 Abdomen: soft, nontender,  nondistended, no obvious ascites, no peritoneal signs, normal bowel sounds     Assessment and plan: 38 y.o. male with hemachromatosis, alcohol abuse  Was very clear to him that drinking alcohol in the setting of hemachromatosis is a very dangerous thing for him to be doing. He would be at much higher risk of cirrhosis if he continues to drink. I told him he should quit completely. Realistically if he keep it under 2-3 beers a week I think that'll be great. Hemachromatosis can lead to liver cancer even without cirrhosis and so he will need hepatoma screening every 6-12 months. His last ultrasound was late 2014. He will have an alpha-fetoprotein done today. His ferritin is rising again. It is  150. My goal became under 73. I suspect he'll need maintenance phlebotomy about once every 3-4 months. He'll have phlebotomy now in about a month from now and then we'll try to settle down a maintenance phlebotomy schedule. He'll come back to the office in 3 months from now.

## 2013-09-09 NOTE — Patient Instructions (Addendum)
You need to stop drinking alcohol.  It is very dangerous for you liver in the setting of hemochromatosis. You will have labs checked today in the basement lab.  Please head down after you check out with the front desk  (AFP level).  May be able to be run from blood in lab from earlier. You will need phlebotomy now and in one month from now and then every 4 months.  Please return to see Dr. Ardis Hughs in 3 months.

## 2013-10-09 ENCOUNTER — Telehealth: Payer: Self-pay

## 2013-10-09 ENCOUNTER — Other Ambulatory Visit: Payer: Self-pay

## 2013-10-09 NOTE — Telephone Encounter (Signed)
erro  neous encounter

## 2013-10-10 ENCOUNTER — Other Ambulatory Visit (INDEPENDENT_AMBULATORY_CARE_PROVIDER_SITE_OTHER): Payer: 59

## 2013-10-11 LAB — AFP TUMOR MARKER: AFP-Tumor Marker: 3.5 ng/mL (ref 0.0–8.0)

## 2013-11-19 ENCOUNTER — Other Ambulatory Visit (INDEPENDENT_AMBULATORY_CARE_PROVIDER_SITE_OTHER): Payer: 59

## 2013-12-24 ENCOUNTER — Telehealth: Payer: Self-pay | Admitting: Gastroenterology

## 2013-12-24 ENCOUNTER — Other Ambulatory Visit: Payer: Self-pay

## 2013-12-24 NOTE — Telephone Encounter (Signed)
I closed the prev phone note by accident

## 2013-12-24 NOTE — Telephone Encounter (Signed)
Dr Ardis Hughs this pt has a follow up 02/25/14, he has had 2 phlebotomies and was told to follow up after that.  Is this appt to late?  Does he need labs prior to that appt?

## 2013-12-24 NOTE — Telephone Encounter (Signed)
That date is OK.  Would like him to have cbc, ferritin this week however. Will advise on timing of next phlebotomies after seeing those labs. Also, I cannot tell when he has had his most recent two phlebotomies. Can you ask him those dates?

## 2013-12-24 NOTE — Telephone Encounter (Signed)
Labs in EPIC Left message on machine to call back  

## 2013-12-24 NOTE — Telephone Encounter (Signed)
Look under the procedures tab for the phlebotomies

## 2013-12-30 NOTE — Telephone Encounter (Signed)
Pt aware and will have labs this week 

## 2014-02-25 ENCOUNTER — Ambulatory Visit: Payer: 59 | Admitting: Gastroenterology

## 2014-06-11 ENCOUNTER — Other Ambulatory Visit (INDEPENDENT_AMBULATORY_CARE_PROVIDER_SITE_OTHER): Payer: 59

## 2014-06-11 LAB — CBC WITH DIFFERENTIAL/PLATELET
BASOS ABS: 0.1 10*3/uL (ref 0.0–0.1)
Basophils Relative: 0.8 % (ref 0.0–3.0)
Eosinophils Absolute: 0.1 10*3/uL (ref 0.0–0.7)
Eosinophils Relative: 1.8 % (ref 0.0–5.0)
HCT: 45.8 % (ref 39.0–52.0)
HEMOGLOBIN: 15.9 g/dL (ref 13.0–17.0)
LYMPHS PCT: 26.9 % (ref 12.0–46.0)
Lymphs Abs: 2.1 10*3/uL (ref 0.7–4.0)
MCHC: 34.8 g/dL (ref 30.0–36.0)
MCV: 98.5 fl (ref 78.0–100.0)
MONOS PCT: 11.7 % (ref 3.0–12.0)
Monocytes Absolute: 0.9 10*3/uL (ref 0.1–1.0)
NEUTROS ABS: 4.7 10*3/uL (ref 1.4–7.7)
NEUTROS PCT: 58.8 % (ref 43.0–77.0)
Platelets: 258 10*3/uL (ref 150.0–400.0)
RBC: 4.65 Mil/uL (ref 4.22–5.81)
RDW: 12.6 % (ref 11.5–15.5)
WBC: 7.9 10*3/uL (ref 4.0–10.5)

## 2014-06-11 LAB — FERRITIN: Ferritin: 170.6 ng/mL (ref 22.0–322.0)

## 2014-12-09 IMAGING — US US ABDOMEN COMPLETE
1 series · 14 of 25 positions shown · non-contrast
Comparison: None.

CLINICAL DATA: Elevated LFT

EXAM:
ULTRASOUND ABDOMEN COMPLETE

[Series 1: us abdomen complete · 0.26mm/px · 14 of 67 slices shown]
[im 1/67]
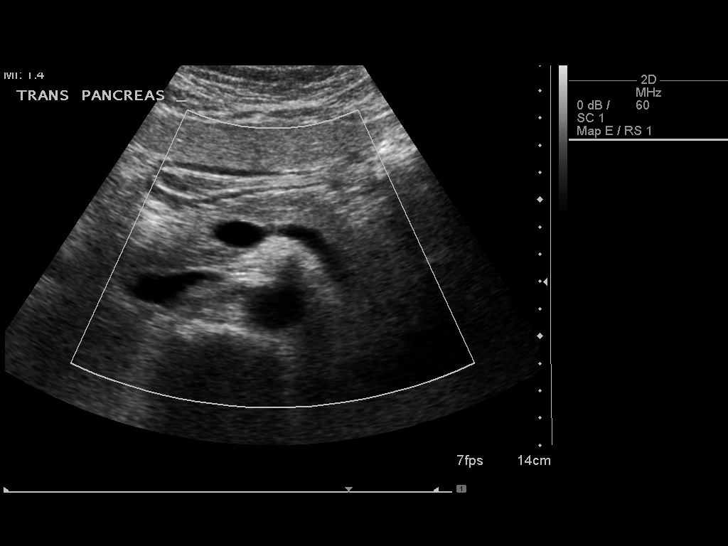
[im 6/67]
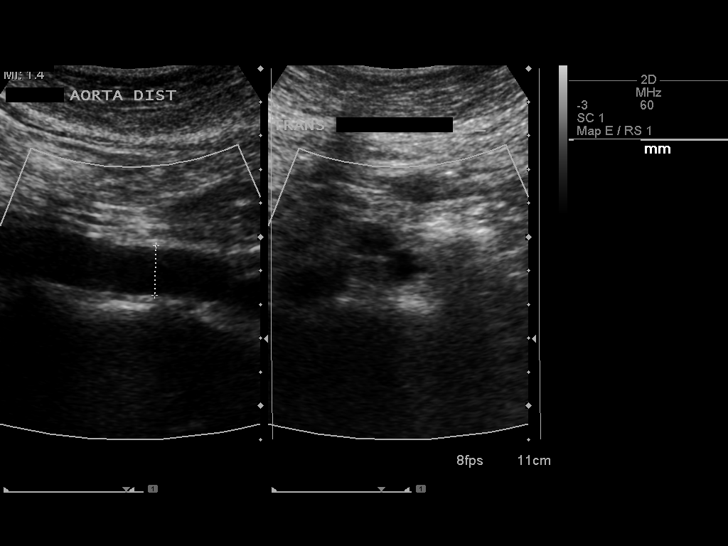
[im 12/67]
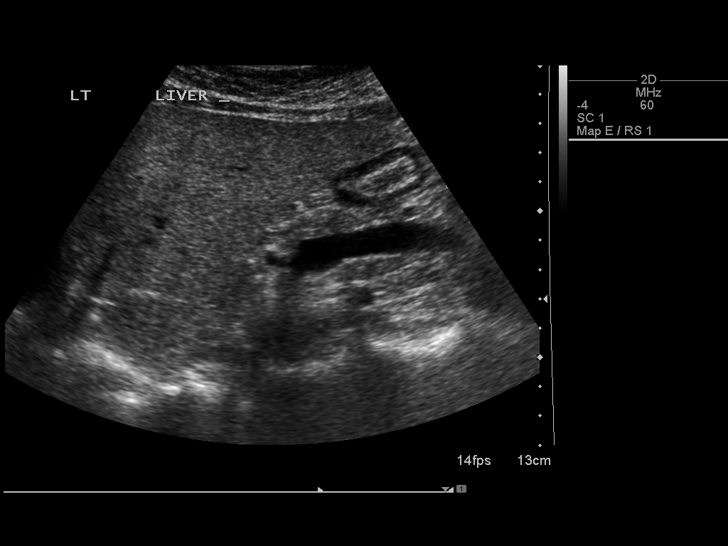
[im 17/67]
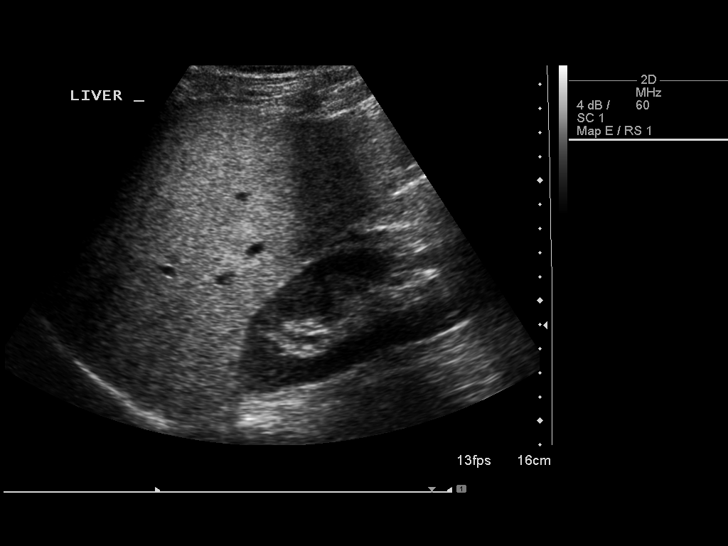
[im 23/67]
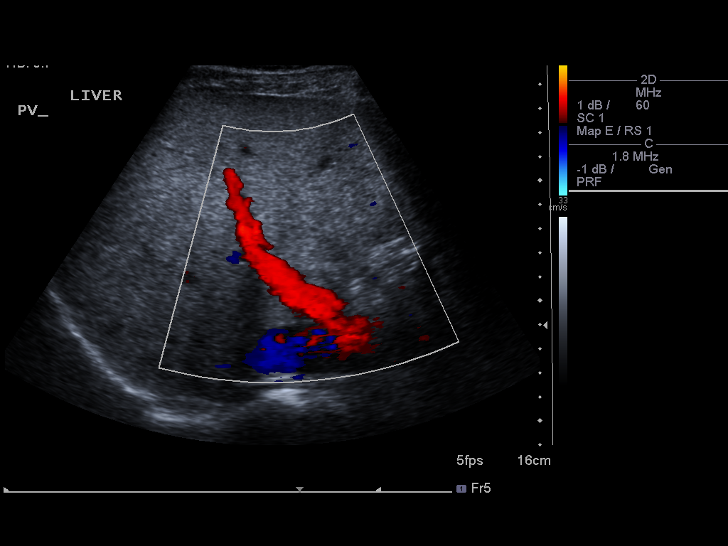
[im 25/67]
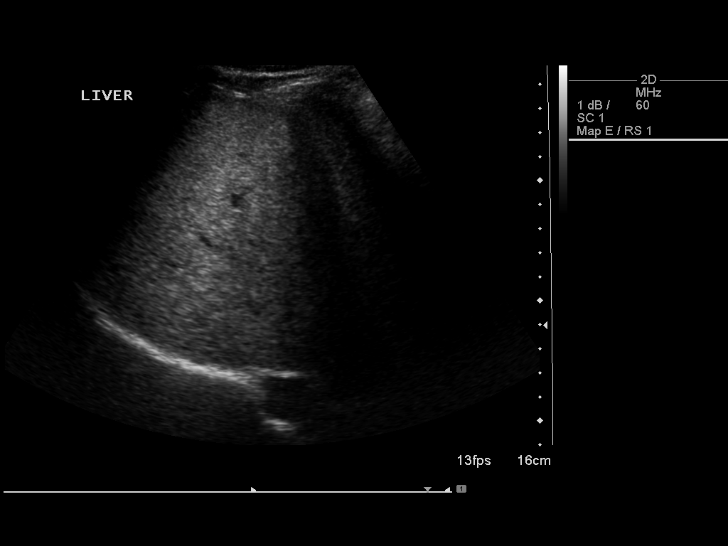
[im 31/67]
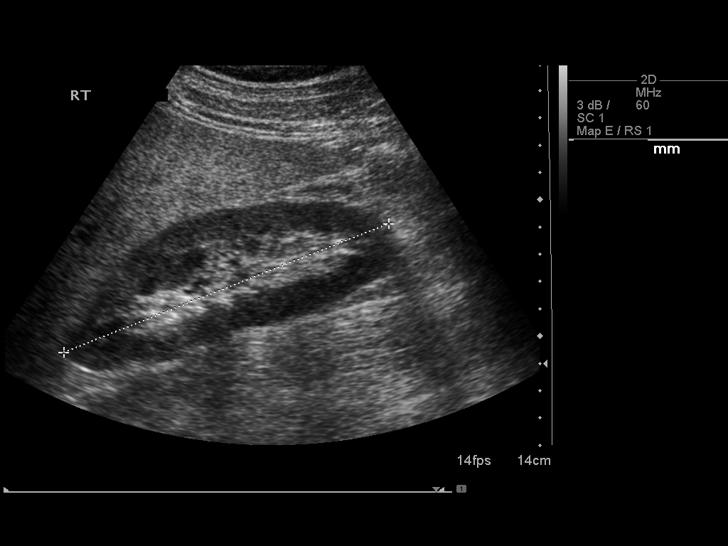
[im 36/67]
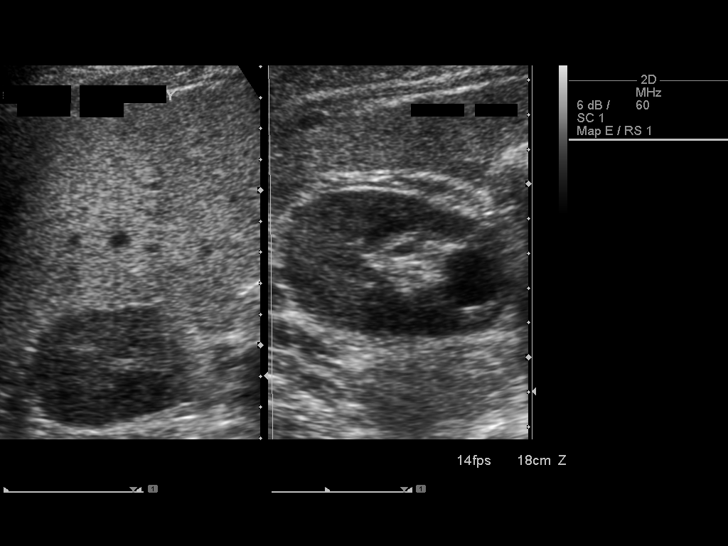
[im 42/67]
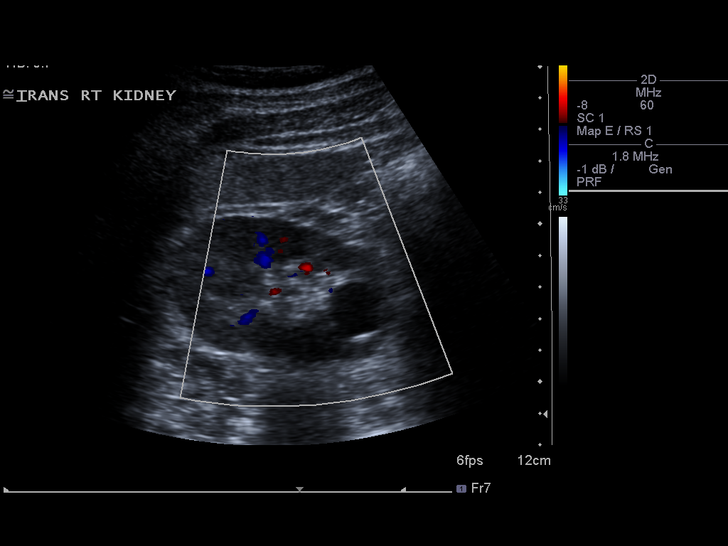
[im 45/67]
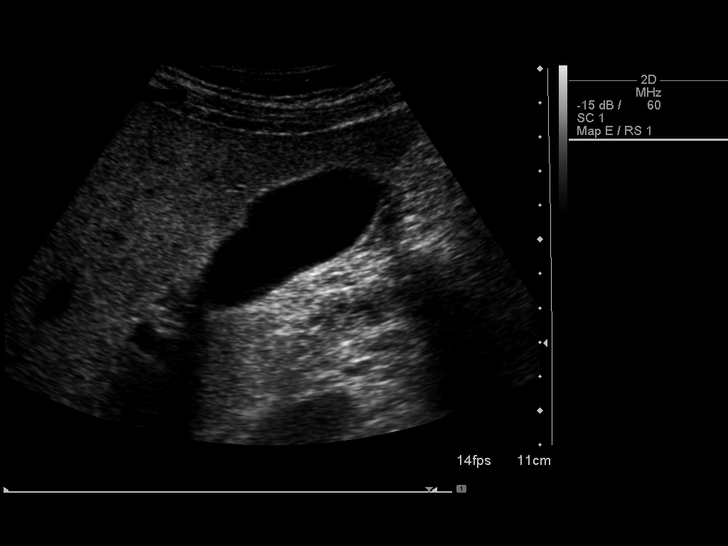
[im 50/67]
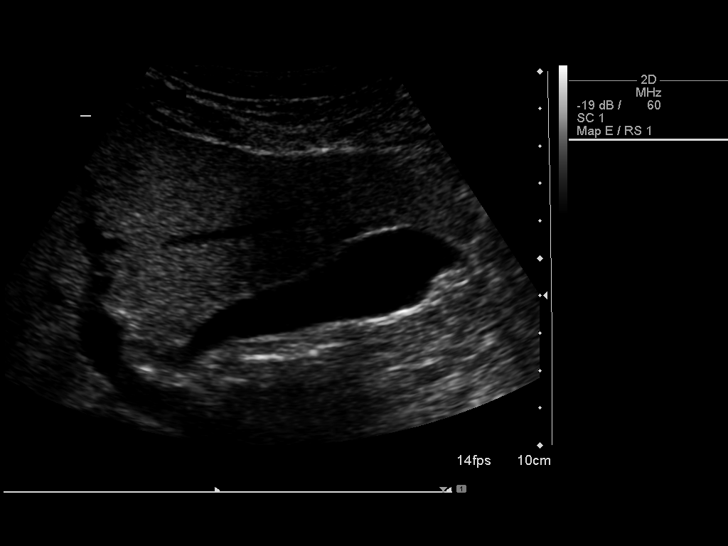
[im 56/67]
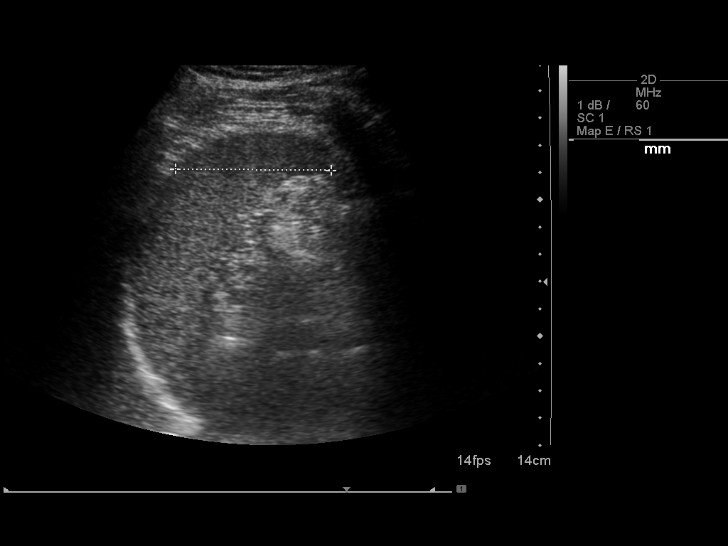
[im 61/67]
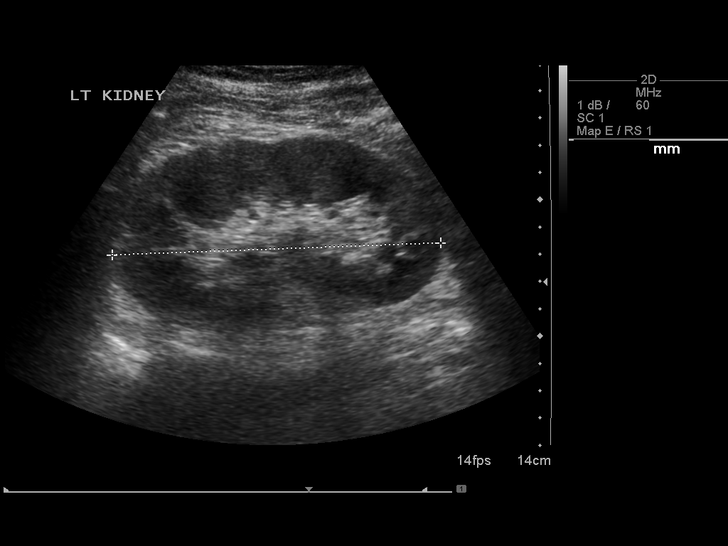
[im 67/67]
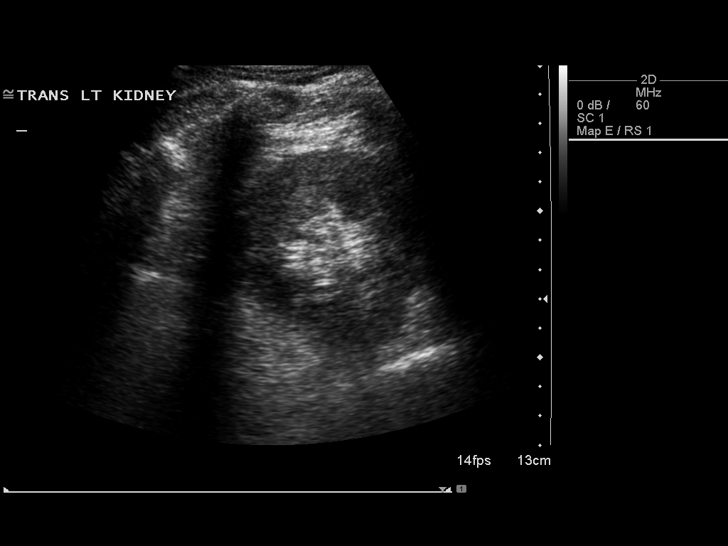

[14 of 25 positions shown; findings below may reference images not displayed]

FINDINGS: Gallbladder

No gallstones or wall thickening visualized. No sonographic Murphy
sign noted.

Common bile duct

Diameter:  2.4 mm

Liver

No focal lesion identified. Within normal limits in parenchymal
echogenicity.

IVC

No abnormality visualized.

Pancreas

Visualized portion unremarkable.

Spleen

Size and appearance within normal limits.

Right Kidney

Length: 12.8 cm.. 17 x 14 mm lower pole cyst. Negative for
obstruction

Left Kidney

Length: 12.1 cm. Echogenicity within normal limits. No mass or
hydronephrosis visualized.

Abdominal aorta

No aneurysm visualized.
IMPRESSION: Negative

## 2015-04-01 ENCOUNTER — Telehealth: Payer: Self-pay | Admitting: Gastroenterology

## 2015-04-01 NOTE — Telephone Encounter (Signed)
Dr Ardis Hughs the pt states he does not feel like you and he are a good fit and would like to see Dr Hilarie Fredrickson or Havery Moros.  Do you agree to the switch?

## 2015-04-02 NOTE — Telephone Encounter (Signed)
Dr. Pyrtle will you accept this pt? See note below. 

## 2015-04-02 NOTE — Telephone Encounter (Signed)
Appointment scheduled for 05/27/15 with Dr. Hilarie Fredrickson

## 2015-04-02 NOTE — Telephone Encounter (Signed)
ok 

## 2015-04-02 NOTE — Telephone Encounter (Signed)
That is OK with me.  I met him for the first time over a year ago, he was supposed to follow up in 2-3 months from that point. 

## 2015-04-02 NOTE — Telephone Encounter (Signed)
Please schedule pt to see Dr. Norman Herrlich, see notes below.

## 2015-04-30 ENCOUNTER — Other Ambulatory Visit (INDEPENDENT_AMBULATORY_CARE_PROVIDER_SITE_OTHER): Payer: 59

## 2015-04-30 DIAGNOSIS — Z0000 Encounter for general adult medical examination without abnormal findings: Secondary | ICD-10-CM | POA: Diagnosis not present

## 2015-04-30 LAB — BASIC METABOLIC PANEL
BUN: 10 mg/dL (ref 6–23)
CHLORIDE: 103 meq/L (ref 96–112)
CO2: 29 mEq/L (ref 19–32)
Calcium: 10.1 mg/dL (ref 8.4–10.5)
Creatinine, Ser: 0.82 mg/dL (ref 0.40–1.50)
GFR: 111.76 mL/min (ref >60.00)
Glucose, Bld: 82 mg/dL (ref 70–99)
POTASSIUM: 4.4 meq/L (ref 3.5–5.1)
SODIUM: 140 meq/L (ref 135–145)

## 2015-04-30 LAB — LIPID PANEL
CHOL/HDL RATIO: 4
Cholesterol: 231 mg/dL — ABNORMAL HIGH (ref 0–200)
HDL: 51.8 mg/dL (ref >39.00)
LDL CALC: 161 mg/dL — AB (ref 0–99)
NONHDL: 179.66
Triglycerides: 92 mg/dL (ref 0.0–149.0)
VLDL: 18.4 mg/dL (ref 0.0–40.0)

## 2015-04-30 LAB — HEPATIC FUNCTION PANEL
ALT: 25 U/L (ref 0–53)
AST: 36 U/L (ref 0–37)
Albumin: 4.5 g/dL (ref 3.5–5.2)
Alkaline Phosphatase: 52 U/L (ref 39–117)
BILIRUBIN DIRECT: 0.1 mg/dL (ref 0.0–0.3)
BILIRUBIN TOTAL: 0.3 mg/dL (ref 0.2–1.2)
Total Protein: 7 g/dL (ref 6.0–8.3)

## 2015-04-30 LAB — CBC WITH DIFFERENTIAL/PLATELET
BASOS PCT: 0.7 % (ref 0.0–3.0)
Basophils Absolute: 0 10*3/uL (ref 0.0–0.1)
EOS PCT: 2.3 % (ref 0.0–5.0)
Eosinophils Absolute: 0.2 10*3/uL (ref 0.0–0.7)
HEMATOCRIT: 47.6 % (ref 39.0–52.0)
HEMOGLOBIN: 16.2 g/dL (ref 13.0–17.0)
LYMPHS PCT: 27.3 % (ref 12.0–46.0)
Lymphs Abs: 1.8 10*3/uL (ref 0.7–4.0)
MCHC: 34.1 g/dL (ref 30.0–36.0)
MCV: 100.2 fl — AB (ref 78.0–100.0)
MONO ABS: 0.8 10*3/uL (ref 0.1–1.0)
MONOS PCT: 12.3 % — AB (ref 3.0–12.0)
Neutro Abs: 3.8 10*3/uL (ref 1.4–7.7)
Neutrophils Relative %: 57.4 % (ref 43.0–77.0)
Platelets: 305 10*3/uL (ref 150.0–400.0)
RBC: 4.75 Mil/uL (ref 4.22–5.81)
RDW: 13 % (ref 11.5–15.5)
WBC: 6.7 10*3/uL (ref 4.0–10.5)

## 2015-04-30 LAB — TSH: TSH: 1.35 u[IU]/mL (ref 0.35–4.50)

## 2015-05-06 ENCOUNTER — Encounter: Payer: 59 | Admitting: Family Medicine

## 2015-05-14 ENCOUNTER — Ambulatory Visit (INDEPENDENT_AMBULATORY_CARE_PROVIDER_SITE_OTHER): Payer: 59 | Admitting: Family Medicine

## 2015-05-14 ENCOUNTER — Encounter: Payer: Self-pay | Admitting: Family Medicine

## 2015-05-14 DIAGNOSIS — E785 Hyperlipidemia, unspecified: Secondary | ICD-10-CM | POA: Diagnosis not present

## 2015-05-14 DIAGNOSIS — Z0001 Encounter for general adult medical examination with abnormal findings: Secondary | ICD-10-CM

## 2015-05-14 DIAGNOSIS — I10 Essential (primary) hypertension: Secondary | ICD-10-CM | POA: Diagnosis not present

## 2015-05-14 DIAGNOSIS — Z0000 Encounter for general adult medical examination without abnormal findings: Secondary | ICD-10-CM

## 2015-05-14 LAB — FERRITIN: Ferritin: 92.7 ng/mL (ref 22.0–322.0)

## 2015-05-14 MED ORDER — BENAZEPRIL HCL 5 MG PO TABS: 5.0000 mg | ORAL_TABLET | Freq: Every day | ORAL | Status: DC

## 2015-05-14 NOTE — Patient Instructions (Addendum)
Smoking Cessation, Tips for Success If you are ready to quit smoking, congratulations! You have chosen to help yourself be healthier. Cigarettes bring nicotine, tar, carbon monoxide, and other irritants into your body. Your lungs, heart, and blood vessels will be able to work better without these poisons. There are many different ways to quit smoking. Nicotine gum, nicotine patches, a nicotine inhaler, or nicotine nasal spray can help with physical craving. Hypnosis, support groups, and medicines help break the habit of smoking. WHAT THINGS CAN I DO TO MAKE QUITTING EASIER?  Here are some tips to help you quit for good:  Pick a date when you will quit smoking completely. Tell all of your friends and family about your plan to quit on that date.  Do not try to slowly cut down on the number of cigarettes you are smoking. Pick a quit date and quit smoking completely starting on that day.  Throw away all cigarettes.   Clean and remove all ashtrays from your home, work, and car.  On a card, write down your reasons for quitting. Carry the card with you and read it when you get the urge to smoke.  Cleanse your body of nicotine. Drink enough water and fluids to keep your urine clear or pale yellow. Do this after quitting to flush the nicotine from your body.  Learn to predict your moods. Do not let a bad situation be your excuse to have a cigarette. Some situations in your life might tempt you into wanting a cigarette.  Never have "just one" cigarette. It leads to wanting another and another. Remind yourself of your decision to quit.  Change habits associated with smoking. If you smoked while driving or when feeling stressed, try other activities to replace smoking. Stand up when drinking your coffee. Brush your teeth after eating. Sit in a different chair when you read the paper. Avoid alcohol while trying to quit, and try to drink fewer caffeinated beverages. Alcohol and caffeine may urge you to  smoke.  Avoid foods and drinks that can trigger a desire to smoke, such as sugary or spicy foods and alcohol.  Ask people who smoke not to smoke around you.  Have something planned to do right after eating or having a cup of coffee. For example, plan to take a walk or exercise.  Try a relaxation exercise to calm you down and decrease your stress. Remember, you may be tense and nervous for the first 2 weeks after you quit, but this will pass.  Find new activities to keep your hands busy. Play with a pen, coin, or rubber band. Doodle or draw things on paper.  Brush your teeth right after eating. This will help cut down on the craving for the taste of tobacco after meals. You can also try mouthwash.   Use oral substitutes in place of cigarettes. Try using lemon drops, carrots, cinnamon sticks, or chewing gum. Keep them handy so they are available when you have the urge to smoke.  When you have the urge to smoke, try deep breathing.  Designate your home as a nonsmoking area.  If you are a heavy smoker, ask your health care provider about a prescription for nicotine chewing gum. It can ease your withdrawal from nicotine.  Reward yourself. Set aside the cigarette money you save and buy yourself something nice.  Look for support from others. Join a support group or smoking cessation program. Ask someone at home or at work to help you with your plan   to quit smoking.  Always ask yourself, "Do I need this cigarette or is this just a reflex?" Tell yourself, "Today, I choose not to smoke," or "I do not want to smoke." You are reminding yourself of your decision to quit.  Do not replace cigarette smoking with electronic cigarettes (commonly called e-cigarettes). The safety of e-cigarettes is unknown, and some may contain harmful chemicals.  If you relapse, do not give up! Plan ahead and think about what you will do the next time you get the urge to smoke. HOW WILL I FEEL WHEN I QUIT SMOKING? You  may have symptoms of withdrawal because your body is used to nicotine (the addictive substance in cigarettes). You may crave cigarettes, be irritable, feel very hungry, cough often, get headaches, or have difficulty concentrating. The withdrawal symptoms are only temporary. They are strongest when you first quit but will go away within 10-14 days. When withdrawal symptoms occur, stay in control. Think about your reasons for quitting. Remind yourself that these are signs that your body is healing and getting used to being without cigarettes. Remember that withdrawal symptoms are easier to treat than the major diseases that smoking can cause.  Even after the withdrawal is over, expect periodic urges to smoke. However, these cravings are generally short lived and will go away whether you smoke or not. Do not smoke! WHAT RESOURCES ARE AVAILABLE TO HELP ME QUIT SMOKING? Your health care provider can direct you to community resources or hospitals for support, which may include:  Group support.  Education.  Hypnosis.  Therapy.   This information is not intended to replace advice given to you by your health care provider. Make sure you discuss any questions you have with your health care provider.   Document Released: 01/07/2004 Document Revised: 05/01/2014 Document Reviewed: 09/26/2012 Elsevier Interactive Patient Education 2016 [place] back Benazepril once daily. Consider baby aspirin.

## 2015-05-14 NOTE — Progress Notes (Signed)
Pre visit review using our clinic review tool, if applicable. No additional management support is needed unless otherwise documented below in the visit note. 

## 2015-05-14 NOTE — Progress Notes (Signed)
Subjective:    Patient ID: Dave Rodriguez, male    DOB: 09-Oct-1975, 40 y.o.   MRN: [id number]  HPI Patient here for physical in absence of his primary provider who is out.  He has history of dyslipidemia, alcohol abuse, hemochromatosis, hypertension. He has not been monitored for his hemachromatosis in a year. Continues to smoke about half pack cigarettes per day.  Has been prescribed benazepril in the past but currently not taking. He still consumes vodka -over 3 ounces per day. He does not feel he has any problems with alcohol this time. Declines flu vaccine. Tetanus up-to-date.  Family history reviewed. Father had coronary disease age 90.  Past Medical History  Diagnosis Date  . Hyperlipidemia   . Hypertension   . Elevated LFTs   . Hemochromatosis    No past surgical history on file.  reports that he has been smoking Cigarettes.  He has a 1.25 pack-year smoking history. He has never used smokeless tobacco. He reports that he drinks about 12.5 - 15.0 oz of alcohol per week. He reports that he does not use illicit drugs. family history includes Colon cancer (age of onset: 66) in his maternal grandmother; Coronary artery disease in his father; Depression in his paternal grandfather; Hyperlipidemia in his father; Hypertension in his father. No Known Allergies    Review of Systems  Constitutional: Negative for fever, activity change, appetite change and fatigue.  HENT: Negative for congestion, ear pain and trouble swallowing.   Eyes: Negative for pain and visual disturbance.  Respiratory: Negative for cough, shortness of breath and wheezing.   Cardiovascular: Negative for chest pain and palpitations.  Gastrointestinal: Negative for nausea, vomiting, abdominal pain, diarrhea, constipation, blood in stool, abdominal distention and rectal pain.  Genitourinary: Negative for dysuria, hematuria and testicular pain.  Musculoskeletal: Negative for joint swelling and arthralgias.  Skin:  Negative for rash.  Neurological: Negative for dizziness, syncope and headaches.  Hematological: Negative for adenopathy.  Psychiatric/Behavioral: Negative for confusion and dysphoric mood.       Objective:   Physical Exam  Constitutional: He is oriented to person, place, and time. He appears well-developed and well-nourished. No distress.  HENT:  Head: Normocephalic and atraumatic.  Right Ear: External ear normal.  Left Ear: External ear normal.  Mouth/Throat: Oropharynx is clear and moist.  Eyes: Conjunctivae and EOM are normal. Pupils are equal, round, and reactive to light.  Neck: Normal range of motion. Neck supple. No thyromegaly present.  Cardiovascular: Normal rate, regular rhythm and normal heart sounds.   No murmur heard. Pulmonary/Chest: No respiratory distress. He has no wheezes. He has no rales.  Abdominal: Soft. Bowel sounds are normal. He exhibits no distension and no mass. There is no tenderness. There is no rebound and no guarding.  Musculoskeletal: He exhibits no edema.  Lymphadenopathy:    He has no cervical adenopathy.  Neurological: He is alert and oriented to person, place, and time. He displays normal reflexes. No cranial nerve deficit.  Skin: No rash noted.  Psychiatric: He has a normal mood and affect.          Assessment & Plan:  Physical exam. Labs reviewed. He has dyslipidemia. Ongoing nicotine use. Following issues were discussed today:  #1 dyslipidemia. He is reluctant to go back on statin therapy this time. 10 year risk of CAD event 6.8%. He prefers lifestyle modification and recheck in about 4 months  #2 nicotine use. Strongly advised to stop. Handout given. He will consider trying to  stop on his own  #3 hypertension. Elevated today. Go back on benazepril 5 mg daily  #4 hemochromatosis. Needs follow-up ferritin. This is drawn today.  Phlebotomy of 500 cc for ferritin > 100.  #5 alcohol abuse. We discussed long-term ramifications and risks.

## 2015-05-18 MED ORDER — BENAZEPRIL HCL 5 MG PO TABS: 5.0000 mg | ORAL_TABLET | Freq: Every day | ORAL | Status: DC

## 2015-05-18 NOTE — Addendum Note (Signed)
Addended by: Elio Forget on: 05/18/2015 04:31 PM   Modules accepted: Orders

## 2015-05-18 NOTE — Addendum Note (Signed)
Addended by: Elio Forget on: 05/18/2015 04:50 PM   Modules accepted: Orders

## 2015-05-27 ENCOUNTER — Telehealth: Payer: Self-pay | Admitting: Internal Medicine

## 2015-05-27 ENCOUNTER — Ambulatory Visit: Payer: 59 | Admitting: Internal Medicine

## 2015-05-27 NOTE — Telephone Encounter (Signed)
Noted  

## 2015-12-03 ENCOUNTER — Other Ambulatory Visit (INDEPENDENT_AMBULATORY_CARE_PROVIDER_SITE_OTHER): Payer: 59

## 2015-12-03 DIAGNOSIS — E785 Hyperlipidemia, unspecified: Secondary | ICD-10-CM

## 2015-12-03 LAB — LIPID PANEL
CHOL/HDL RATIO: 5
Cholesterol: 210 mg/dL — ABNORMAL HIGH (ref 0–200)
HDL: 42.5 mg/dL (ref >39.00)
LDL CALC: 142 mg/dL — AB (ref 0–99)
NONHDL: 167.14
Triglycerides: 128 mg/dL (ref 0.0–149.0)
VLDL: 25.6 mg/dL (ref 0.0–40.0)

## 2015-12-03 LAB — FERRITIN: Ferritin: 252.6 ng/mL (ref 22.0–322.0)

## 2015-12-10 ENCOUNTER — Other Ambulatory Visit: Payer: 59

## 2016-07-31 ENCOUNTER — Other Ambulatory Visit: Payer: Self-pay | Admitting: *Deleted

## 2016-07-31 MED ORDER — BENAZEPRIL HCL 5 MG PO TABS: 5.0000 mg | ORAL_TABLET | Freq: Every day | ORAL | 0 refills | Status: DC

## 2016-08-11 ENCOUNTER — Ambulatory Visit (INDEPENDENT_AMBULATORY_CARE_PROVIDER_SITE_OTHER): Payer: 59 | Admitting: Family Medicine

## 2016-08-11 ENCOUNTER — Encounter: Payer: Self-pay | Admitting: Family Medicine

## 2016-08-11 VITALS — BP 160/100 | Temp 98.5°F | Ht 70.5 in | Wt 173.8 lb

## 2016-08-11 DIAGNOSIS — Z0000 Encounter for general adult medical examination without abnormal findings: Secondary | ICD-10-CM | POA: Diagnosis not present

## 2016-08-11 DIAGNOSIS — L409 Psoriasis, unspecified: Secondary | ICD-10-CM | POA: Diagnosis not present

## 2016-08-11 LAB — CBC WITH DIFFERENTIAL/PLATELET
Basophils Absolute: 0.1 10*3/uL (ref 0.0–0.1)
Basophils Relative: 0.7 % (ref 0.0–3.0)
EOS ABS: 0.1 10*3/uL (ref 0.0–0.7)
EOS PCT: 0.9 % (ref 0.0–5.0)
HCT: 49 % (ref 39.0–52.0)
Hemoglobin: 16.8 g/dL (ref 13.0–17.0)
LYMPHS ABS: 1.5 10*3/uL (ref 0.7–4.0)
LYMPHS PCT: 20.7 % (ref 12.0–46.0)
MCHC: 34.3 g/dL (ref 30.0–36.0)
MCV: 101.7 fl — ABNORMAL HIGH (ref 78.0–100.0)
MONO ABS: 0.9 10*3/uL (ref 0.1–1.0)
Monocytes Relative: 11.6 % (ref 3.0–12.0)
NEUTROS PCT: 66.1 % (ref 43.0–77.0)
Neutro Abs: 4.9 10*3/uL (ref 1.4–7.7)
Platelets: 329 10*3/uL (ref 150.0–400.0)
RBC: 4.82 Mil/uL (ref 4.22–5.81)
RDW: 12.6 % (ref 11.5–15.5)
WBC: 7.5 10*3/uL (ref 4.0–10.5)

## 2016-08-11 LAB — HEPATIC FUNCTION PANEL
ALBUMIN: 5.2 g/dL (ref 3.5–5.2)
ALK PHOS: 57 U/L (ref 39–117)
ALT: 25 U/L (ref 0–53)
AST: 38 U/L — ABNORMAL HIGH (ref 0–37)
BILIRUBIN TOTAL: 0.5 mg/dL (ref 0.2–1.2)
Bilirubin, Direct: 0.1 mg/dL (ref 0.0–0.3)
Total Protein: 7.6 g/dL (ref 6.0–8.3)

## 2016-08-11 LAB — BASIC METABOLIC PANEL
BUN: 9 mg/dL (ref 6–23)
CALCIUM: 10.5 mg/dL (ref 8.4–10.5)
CO2: 30 mEq/L (ref 19–32)
CREATININE: 0.85 mg/dL (ref 0.40–1.50)
Chloride: 103 mEq/L (ref 96–112)
GFR: 106.5 mL/min (ref >60.00)
Glucose, Bld: 90 mg/dL (ref 70–99)
POTASSIUM: 5.8 meq/L — AB (ref 3.5–5.1)
Sodium: 140 mEq/L (ref 135–145)

## 2016-08-11 LAB — LIPID PANEL
CHOLESTEROL: 251 mg/dL — AB (ref 0–200)
HDL: 61.8 mg/dL (ref >39.00)
LDL Cholesterol: 175 mg/dL — ABNORMAL HIGH (ref 0–99)
NONHDL: 189.64
Total CHOL/HDL Ratio: 4
Triglycerides: 74 mg/dL (ref 0.0–149.0)
VLDL: 14.8 mg/dL (ref 0.0–40.0)

## 2016-08-11 LAB — TSH: TSH: 1.92 u[IU]/mL (ref 0.35–4.50)

## 2016-08-11 LAB — FERRITIN: FERRITIN: 105.3 ng/mL (ref 22.0–322.0)

## 2016-08-11 MED ORDER — BENAZEPRIL HCL 10 MG PO TABS: 10.0000 mg | ORAL_TABLET | Freq: Every day | ORAL | 3 refills | Status: DC

## 2016-08-11 MED ORDER — DESOXIMETASONE 0.25 % EX CREA: TOPICAL_CREAM | CUTANEOUS | 3 refills | Status: DC

## 2016-08-11 NOTE — Progress Notes (Signed)
Subjective:     Patient ID: Dave Rodriguez, male   DOB: 1976-01-12, 41 y.o.   MRN: [id number]  HPI Patient for physical exam. He's not been seen since physical last year. He has history of hemochromatosis, nicotine use, dyslipidemia, hypertension. He ran out of his blood pressure medication 2 weeks ago. He drinks currently 3 ounces of vodka per day. He is had somewhat more the past. Smokes about half pack cigarettes per day. Father had coronary disease age 87.  Patient denies any chest pains, dyspnea, headaches, or any other complaints.  He does have history of psoriatic skin rash currently mild in the buttock area. 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  Past Medical History:  Diagnosis Date  . Elevated LFTs   . Hemochromatosis   . Hyperlipidemia   . Hypertension    No past surgical history on file.  reports that he has been smoking Cigarettes.  He has a 1.25 pack-year smoking history. He has never used smokeless tobacco. He reports that he drinks about 12.5 - 15.0 oz of alcohol per week . He reports that he does not use drugs. family history includes Colon cancer (age of onset: 62) in his maternal grandmother; Coronary artery disease in his father; Depression in his paternal grandfather; Hyperlipidemia in his father; Hypertension in his father. No Known Allergies   Review of Systems  Constitutional: Negative for activity change, appetite change, fatigue and fever.  HENT: Negative for congestion, ear pain, trouble swallowing and voice change.   Eyes: Negative for pain and visual disturbance.  Respiratory: Negative for cough, chest tightness, shortness of breath and wheezing.   Cardiovascular: Negative for chest pain, palpitations and leg swelling.  Gastrointestinal: Negative for abdominal distention, abdominal pain, blood in stool, constipation, diarrhea, nausea, rectal pain and vomiting.  Endocrine: Negative for polydipsia and polyuria.  Genitourinary:  Negative for dysuria, hematuria and testicular pain.  Musculoskeletal: Negative for arthralgias and joint swelling.  Skin: Positive for rash.  Neurological: Negative for dizziness, syncope, weakness, light-headedness and headaches.  Hematological: Negative for adenopathy.  Psychiatric/Behavioral: Negative for confusion and dysphoric mood.       Objective:   Physical Exam  Constitutional: He is oriented to person, place, and time. He appears well-developed and well-nourished. No distress.  HENT:  Head: Normocephalic and atraumatic.  Right Ear: External ear normal.  Left Ear: External ear normal.  Mouth/Throat: Oropharynx is clear and moist.  Eyes: Conjunctivae and EOM are normal. Pupils are equal, round, and reactive to light.  Neck: Normal range of motion. Neck supple. No thyromegaly present.  Cardiovascular: Normal rate, regular rhythm and normal heart sounds.   No murmur heard. Pulmonary/Chest: No respiratory distress. He has no wheezes. He has no rales.  Abdominal: Soft. Bowel sounds are normal. He exhibits no distension and no mass. There is no tenderness. There is no rebound and no guarding.  Musculoskeletal: He exhibits no edema.  Lymphadenopathy:    He has no cervical adenopathy.  Neurological: He is alert and oriented to person, place, and time. He displays normal reflexes. No cranial nerve deficit.  Skin: Rash noted.  Thick well-demarcated erythematous scaly rash buttock area over fairly large surface area  Psychiatric: He has a normal mood and affect.       Assessment:     #1 physical exam. We addressed several issues including the fact that he needs to get more regular monitoring of ferritin and dressing is hemochromatosis. He also received back on  blood pressure medication. We dressed alcohol use. He is encouraged to stop smoking.  #2 ongoing nicotine use  #3 hypertension currently untreated  #4 hemochromatosis with infrequent follow-up as above  #5  psoriasis      Plan:     -Is encouraged to stop smoking -Get back on benazepril 10 mg daily and reassess blood pressure in 2 months -Check lab work and include ferritin level. -phlebotomy of 500 mL blood if ferritin level over 100 as suspect it will be -Consider baby aspirin 81 mg once daily -Topicort 0.25% cream twice daily as needed for psoriasis rash but no more than 2 weeks of continuous use  Eulas Post MD [facility]  -

## 2016-08-11 NOTE — Patient Instructions (Signed)
Consider baby aspirin one daily Try to quit smoking! No more than ounces Vodka per day.

## 2016-08-11 NOTE — Progress Notes (Signed)
Pre visit review using our clinic review tool, if applicable. No additional management support is needed unless otherwise documented below in the visit note. 

## 2016-08-16 ENCOUNTER — Other Ambulatory Visit: Payer: Self-pay | Admitting: Family Medicine

## 2016-08-16 DIAGNOSIS — E785 Hyperlipidemia, unspecified: Secondary | ICD-10-CM

## 2016-08-16 MED ORDER — ROSUVASTATIN CALCIUM 20 MG PO TABS: 20.0000 mg | ORAL_TABLET | Freq: Every day | ORAL | 0 refills | Status: DC

## 2016-10-09 ENCOUNTER — Other Ambulatory Visit: Payer: Self-pay | Admitting: Family Medicine

## 2016-10-13 ENCOUNTER — Ambulatory Visit (INDEPENDENT_AMBULATORY_CARE_PROVIDER_SITE_OTHER): Payer: 59 | Admitting: Adult Health

## 2016-10-13 ENCOUNTER — Other Ambulatory Visit (INDEPENDENT_AMBULATORY_CARE_PROVIDER_SITE_OTHER): Payer: 59

## 2016-10-13 ENCOUNTER — Ambulatory Visit: Payer: 59 | Admitting: Family Medicine

## 2016-10-13 ENCOUNTER — Other Ambulatory Visit: Payer: Self-pay | Admitting: Family Medicine

## 2016-10-13 ENCOUNTER — Encounter: Payer: Self-pay | Admitting: Adult Health

## 2016-10-13 VITALS — BP 106/84 | Ht 70.5 in | Wt 173.8 lb

## 2016-10-13 DIAGNOSIS — H00014 Hordeolum externum left upper eyelid: Secondary | ICD-10-CM | POA: Diagnosis not present

## 2016-10-13 DIAGNOSIS — R7989 Other specified abnormal findings of blood chemistry: Secondary | ICD-10-CM

## 2016-10-13 DIAGNOSIS — E785 Hyperlipidemia, unspecified: Secondary | ICD-10-CM

## 2016-10-13 LAB — HEPATIC FUNCTION PANEL
ALT: 23 U/L (ref 0–53)
AST: 27 U/L (ref 0–37)
Albumin: 4.7 g/dL (ref 3.5–5.2)
Alkaline Phosphatase: 53 U/L (ref 39–117)
Bilirubin, Direct: 0.1 mg/dL (ref 0.0–0.3)
Total Bilirubin: 0.3 mg/dL (ref 0.2–1.2)
Total Protein: 6.6 g/dL (ref 6.0–8.3)

## 2016-10-13 LAB — LIPID PANEL
Cholesterol: 149 mg/dL (ref 0–200)
HDL: 43.7 mg/dL (ref >39.00)
NonHDL: 104.97
Total CHOL/HDL Ratio: 3
Triglycerides: 221 mg/dL — ABNORMAL HIGH (ref 0.0–149.0)
VLDL: 44.2 mg/dL — ABNORMAL HIGH (ref 0.0–40.0)

## 2016-10-13 LAB — FERRITIN: FERRITIN: 97.6 ng/mL (ref 22.0–322.0)

## 2016-10-13 LAB — LDL CHOLESTEROL, DIRECT: LDL DIRECT: 81 mg/dL

## 2016-10-13 NOTE — Progress Notes (Signed)
   Subjective:    Patient ID: Dave Rodriguez, male    DOB: 1976/03/05, 41 y.o.   MRN: [id number]  HPI  41 year old male who  has a past medical history of Elevated LFTs; Hemochromatosis; Hyperlipidemia; and Hypertension. He presents to the office today for a red, painful bump on his left upper eye lid. This has been present for 2 -3 days. Improvement this morning in pain and size. Has had minimal drainage last night   No vision disturbance, eye pain, or injected conjunctiva   Review of Systems See HPI  Past Medical History:  Diagnosis Date  . Elevated LFTs   . Hemochromatosis   . Hyperlipidemia   . Hypertension     Social History   Social History  . Marital status: Divorced    Spouse name: N/A  . Number of children: 3  . Years of education: N/A   Occupational History  .  [employer]   Social History Main Topics  . Smoking status: Current Every Day Smoker    Packs/day: 0.50    Years: 2.50    Types: Cigarettes  . Smokeless tobacco: Never Used  . Alcohol use 12.5 - 15.0 oz/week    25 - 30 Standard drinks or equivalent per week  . Drug use: No  . Sexual activity: Not on file   Other Topics Concern  . Not on file   Social History Narrative  . No narrative on file    No past surgical history on file.  Family History  Problem Relation Age of Onset  . Coronary artery disease Unknown   . Hypertension Unknown   . Colon cancer Maternal Grandmother 78  . Hypertension Father   . Hyperlipidemia Father   . Coronary artery disease Father   . Depression Paternal Grandfather     No Known Allergies  Current Outpatient Prescriptions on File Prior to Visit  Medication Sig Dispense Refill  . benazepril (LOTENSIN) 10 MG tablet Take 1 tablet (10 mg total) by mouth daily. 90 tablet 3  . desoximetasone (TOPICORT) 0.25 % cream Apply to affected area bid no more than 2 weeks continuously. 30 g 3  . rosuvastatin (CRESTOR) 20 MG tablet TAKE 1 TABLET BY MOUTH  DAILY 90  tablet 2   No current facility-administered medications on file prior to visit.     BP 106/84 (BP Location: Left Arm, Patient Position: Sitting, Cuff Size: Normal)   Ht 5' 10.5" (1.791 m)   Wt 173 lb 12.8 oz (78.8 kg)   BMI 24.59 kg/m       Objective:   Physical Exam  Constitutional: He appears well-developed and well-nourished. No distress.  HENT:  Localized redness and swelling to left upper eye lid. Stye noted   Eyes: Conjunctivae and EOM are normal. Pupils are equal, round, and reactive to light. Lids are everted and swept, no foreign bodies found. Right eye exhibits no discharge. Left eye exhibits no discharge.    Skin: He is not diaphoretic.  Nursing note and vitals reviewed.     Assessment & Plan:  1. Hordeolum externum of left upper eyelid - Warm compresses atleast 4 times a day x 15 min a time - Massage - Follow up if not resolved in 1 week or sooner if symptoms do not improve  - Consider referral to opthalmology   Dorothyann Peng, NP

## 2016-11-17 ENCOUNTER — Other Ambulatory Visit: Payer: Self-pay

## 2017-01-11 ENCOUNTER — Encounter: Payer: Self-pay | Admitting: Family Medicine

## 2017-04-06 ENCOUNTER — Other Ambulatory Visit: Payer: Self-pay

## 2017-04-13 ENCOUNTER — Other Ambulatory Visit (INDEPENDENT_AMBULATORY_CARE_PROVIDER_SITE_OTHER): Payer: 59

## 2017-04-13 LAB — FERRITIN: FERRITIN: 117.2 ng/mL (ref 22.0–322.0)

## 2017-05-25 ENCOUNTER — Encounter: Payer: Self-pay | Admitting: Family Medicine

## 2017-05-28 ENCOUNTER — Other Ambulatory Visit: Payer: Self-pay | Admitting: *Deleted

## 2017-05-28 MED ORDER — SILDENAFIL CITRATE 100 MG PO TABS: ORAL_TABLET | ORAL | 3 refills | Status: DC

## 2017-05-28 NOTE — Telephone Encounter (Signed)
Rx done and the pt was informed via Mychart message. 

## 2017-06-27 ENCOUNTER — Telehealth: Payer: Self-pay | Admitting: Family Medicine

## 2017-06-27 NOTE — Telephone Encounter (Unsigned)
Copied from [place] [id number]. Topic: Quick Communication - Rx Refill/Question >> Jun 27, 2017 11:31 AM Carolyn Stare wrote: [facility] in [place] call to ask if pt can get 30 instead of 6 of the below med   Medication   sildenafil (VIAGRA) 100 MG tablet   Has the patient contacted their pharmacy yes     Preferred [facility]  [phone]  Agent: Please be advised that RX refills may take up to 3 business days. We ask that you follow-up with your pharmacy.

## 2017-06-27 NOTE — Telephone Encounter (Signed)
Pharmacy is wanting to change the count to #30 . Request forwarded.

## 2017-06-27 NOTE — Telephone Encounter (Signed)
Ok to change count to #30.

## 2017-06-28 NOTE — Telephone Encounter (Signed)
I called [facility] and gave a verbal order to [place] of the approval below.

## 2017-07-12 ENCOUNTER — Other Ambulatory Visit: Payer: Self-pay | Admitting: Family Medicine

## 2017-10-09 ENCOUNTER — Other Ambulatory Visit: Payer: Self-pay | Admitting: Family Medicine

## 2017-12-31 ENCOUNTER — Ambulatory Visit (INDEPENDENT_AMBULATORY_CARE_PROVIDER_SITE_OTHER): Payer: BLUE CROSS/BLUE SHIELD | Admitting: Family Medicine

## 2017-12-31 ENCOUNTER — Encounter: Payer: Self-pay | Admitting: Family Medicine

## 2017-12-31 VITALS — BP 128/86 | HR 70 | Temp 98.1°F | Ht 70.0 in | Wt 170.4 lb

## 2017-12-31 DIAGNOSIS — E785 Hyperlipidemia, unspecified: Secondary | ICD-10-CM | POA: Diagnosis not present

## 2017-12-31 DIAGNOSIS — I10 Essential (primary) hypertension: Secondary | ICD-10-CM | POA: Diagnosis not present

## 2017-12-31 DIAGNOSIS — Z0000 Encounter for general adult medical examination without abnormal findings: Secondary | ICD-10-CM

## 2017-12-31 LAB — HEPATIC FUNCTION PANEL
ALBUMIN: 5 g/dL (ref 3.5–5.2)
ALT: 22 U/L (ref 0–53)
AST: 30 U/L (ref 0–37)
Alkaline Phosphatase: 55 U/L (ref 39–117)
Bilirubin, Direct: 0.1 mg/dL (ref 0.0–0.3)
TOTAL PROTEIN: 7.3 g/dL (ref 6.0–8.3)
Total Bilirubin: 0.6 mg/dL (ref 0.2–1.2)

## 2017-12-31 LAB — BASIC METABOLIC PANEL
BUN: 9 mg/dL (ref 6–23)
CHLORIDE: 101 meq/L (ref 96–112)
CO2: 28 meq/L (ref 19–32)
Calcium: 10 mg/dL (ref 8.4–10.5)
Creatinine, Ser: 0.79 mg/dL (ref 0.40–1.50)
GFR: 115.07 mL/min (ref >60.00)
GLUCOSE: 86 mg/dL (ref 70–99)
Potassium: 5 mEq/L (ref 3.5–5.1)
Sodium: 137 mEq/L (ref 135–145)

## 2017-12-31 LAB — LIPID PANEL
CHOL/HDL RATIO: 3
Cholesterol: 193 mg/dL (ref 0–200)
HDL: 62.8 mg/dL (ref >39.00)
LDL CALC: 104 mg/dL — AB (ref 0–99)
NONHDL: 129.75
TRIGLYCERIDES: 128 mg/dL (ref 0.0–149.0)
VLDL: 25.6 mg/dL (ref 0.0–40.0)

## 2017-12-31 LAB — FERRITIN: Ferritin: 187.7 ng/mL (ref 22.0–322.0)

## 2017-12-31 MED ORDER — DESOXIMETASONE 0.25 % EX CREA: TOPICAL_CREAM | CUTANEOUS | 3 refills | Status: DC

## 2017-12-31 MED ORDER — ROSUVASTATIN CALCIUM 20 MG PO TABS: 20.0000 mg | ORAL_TABLET | Freq: Every day | ORAL | 3 refills | Status: DC

## 2017-12-31 MED ORDER — BENAZEPRIL HCL 10 MG PO TABS: 10.0000 mg | ORAL_TABLET | Freq: Every day | ORAL | 3 refills | Status: DC

## 2017-12-31 MED ORDER — TADALAFIL 20 MG PO TABS: 10.0000 mg | ORAL_TABLET | ORAL | 11 refills | Status: DC | PRN

## 2017-12-31 NOTE — Progress Notes (Signed)
  Subjective:     Patient ID: Dave Rodriguez, male   DOB: 08-Sep-1975, 42 y.o.   MRN: [id number]  HPI Patient here for physical exam. He has history of hypertension, hyperlipidemia, hemochromatosis. He's actually not had phlebotomy in over couple years but ferritin level has been relatively stable. Last ferritin level 117. Still smokes one pack cigarettes per day. He takes benazepril for hypertension and Crestor for hyperlipidemia. Compliant with medications.  No recent chest pains. No myalgias. History of erectile dysfunction. We tried Viagra last year he did not see much improvement with that.   Still smokes about one pack per day but low motivation to quit.  Past Medical History:  Diagnosis Date  . Elevated LFTs   . Hemochromatosis   . Hyperlipidemia   . Hypertension    No past surgical history on file.  reports that he has been smoking cigarettes. He has a 1.25 pack-year smoking history. He has never used smokeless tobacco. He reports that he drinks about 25.0 - 30.0 standard drinks of alcohol per week. He reports that he does not use drugs. family history includes Colon cancer (age of onset: 6) in his maternal grandmother; Coronary artery disease in his father and unknown relative; Depression in his paternal grandfather; Hyperlipidemia in his father; Hypertension in his father and unknown relative. No Known Allergies   Review of Systems  Constitutional: Negative for fatigue and unexpected weight change.  Eyes: Negative for visual disturbance.  Respiratory: Negative for cough, chest tightness and shortness of breath.   Cardiovascular: Negative for chest pain, palpitations and leg swelling.  Gastrointestinal: Negative for abdominal pain.  Endocrine: Negative for polydipsia and polyuria.  Genitourinary: Negative for dysuria.  Neurological: Negative for dizziness, syncope, weakness, light-headedness and headaches.       Objective:   Physical Exam  Constitutional: He is oriented to  person, place, and time. He appears well-developed and well-nourished. No distress.  HENT:  Head: Normocephalic and atraumatic.  Right Ear: External ear normal.  Left Ear: External ear normal.  Mouth/Throat: Oropharynx is clear and moist.  Eyes: Pupils are equal, round, and reactive to light. Conjunctivae and EOM are normal.  Neck: Normal range of motion. Neck supple. No thyromegaly present.  Cardiovascular: Normal rate, regular rhythm and normal heart sounds.  No murmur heard. Pulmonary/Chest: No respiratory distress. He has no wheezes. He has no rales.  Abdominal: Soft. Bowel sounds are normal. He exhibits no distension and no mass. There is no tenderness. There is no rebound and no guarding.  Musculoskeletal: He exhibits no edema.  Lymphadenopathy:    He has no cervical adenopathy.  Neurological: He is alert and oriented to person, place, and time. He displays normal reflexes. No cranial nerve deficit.  Skin: No rash noted.  Psychiatric: He has a normal mood and affect.       Assessment:     Physical exam. His chronic problems as above including hyperlipidemia and hypertension along with ongoing nicotine use. The following issues were discussed    Plan:     -flu vaccine offered and patient declines -Tetanus update -Refilled regular medications for one year -Recheck labs including lipid panel, hepatic panel, basic metabolic panel, ferritin -Smoking cessation discussed. His current motivation is low -Prescription for Cialis 20 mg one every other day as needed.  Eulas Post MD [facility]

## 2018-01-01 NOTE — Addendum Note (Signed)
Addended by: Agnes Lawrence on: 01/01/2018 11:07 AM   Modules accepted: Orders

## 2018-01-04 ENCOUNTER — Other Ambulatory Visit: Payer: BLUE CROSS/BLUE SHIELD

## 2018-04-05 ENCOUNTER — Other Ambulatory Visit: Payer: BLUE CROSS/BLUE SHIELD

## 2018-07-02 ENCOUNTER — Other Ambulatory Visit (INDEPENDENT_AMBULATORY_CARE_PROVIDER_SITE_OTHER): Payer: BLUE CROSS/BLUE SHIELD

## 2018-07-02 LAB — FERRITIN: Ferritin: 96.1 ng/mL (ref 22.0–322.0)

## 2018-11-20 DIAGNOSIS — Z79899 Other long term (current) drug therapy: Secondary | ICD-10-CM | POA: Diagnosis not present

## 2018-11-20 DIAGNOSIS — I10 Essential (primary) hypertension: Secondary | ICD-10-CM | POA: Diagnosis not present

## 2018-11-20 DIAGNOSIS — F17210 Nicotine dependence, cigarettes, uncomplicated: Secondary | ICD-10-CM | POA: Diagnosis not present

## 2018-11-20 DIAGNOSIS — R079 Chest pain, unspecified: Secondary | ICD-10-CM | POA: Diagnosis not present

## 2018-11-20 DIAGNOSIS — R0789 Other chest pain: Secondary | ICD-10-CM | POA: Diagnosis not present

## 2018-11-20 DIAGNOSIS — R202 Paresthesia of skin: Secondary | ICD-10-CM | POA: Diagnosis not present

## 2018-11-20 LAB — CBC AND DIFFERENTIAL
HCT: 42 (ref 41–53)
Hemoglobin: 14.9 (ref 13.5–17.5)
Neutrophils Absolute: 50
Platelets: 247 (ref 150–399)
WBC: 4.9

## 2018-11-20 LAB — HEPATIC FUNCTION PANEL
ALT: 112 — AB (ref 10–40)
AST: 179 — AB (ref 14–40)
Alkaline Phosphatase: 72 (ref 25–125)
Bilirubin, Total: 0.4

## 2018-11-20 LAB — BASIC METABOLIC PANEL
BUN: 6 (ref 4–21)
Creatinine: 0.8 (ref <=1.3)
Glucose: 103
Potassium: 3.7 (ref 3.4–5.3)
Sodium: 141 (ref 137–147)

## 2018-11-25 ENCOUNTER — Encounter: Payer: Self-pay | Admitting: Family Medicine

## 2018-11-25 ENCOUNTER — Telehealth: Payer: Self-pay

## 2018-11-25 NOTE — Telephone Encounter (Signed)
Pt called in and stated that he was calling Dave Rodriguez back about record from [place]??  Dave Rodriguez was was in a room with a pt .

## 2018-11-25 NOTE — Telephone Encounter (Signed)
Keep virtual.

## 2018-11-25 NOTE — Telephone Encounter (Signed)
Sent patient a MyChart message and let him know to keep his appointment virtual.  I had told him earlier when I talked to him that his records from [facility] are not available under Care Everywhere. Patient has been sent a message.

## 2018-11-25 NOTE — Telephone Encounter (Signed)
Called patient and [place] the beach when this started last Wednesday. He stated he had diarrhea, elevated BP, fatigue, coughing and sneezing but he has allergies also. Patient was having left sided abdominal pain also. Went to [facility] in [place] at [employer]. Patient will have notes faxed to [place]. Patient states he was not tested for Covid.  I have schedule appointment tomorrow at 2:30pm for Doxy.  OK to keep virtual?   Copied from [place] [id number]. Topic: Quick Communication - See Telephone Encounter >> Nov 25, 2018 10:30 AM Loma Boston wrote: CRM for notification. See Telephone encounter for: 11/25/18.Called office they requested CRM for appt. Pt was calling to request appt because MyChart had not been answered. Pls respond ASAP pt was on vacation and had chest pains with elevated BP went to ER and EKG done but told to FU with dr. >> Nov 25, 2018 10:53 AM Cox, Melburn Hake, CMA wrote: Appointment Request From: Dora Sims  With Provider: Carolann Littler, MD [facility]  Preferred Date Range: 11/26/2018 - 11/26/2018  Preferred Times: Any Time  Reason for visit: Request an Appointment  Comments: Blood pressure & upper respiratory concerns.  no fever.  no Covid exposure that I am aware of.       Do you want to see pt in-office?

## 2018-11-26 ENCOUNTER — Other Ambulatory Visit: Payer: Self-pay

## 2018-11-26 ENCOUNTER — Ambulatory Visit (INDEPENDENT_AMBULATORY_CARE_PROVIDER_SITE_OTHER): Payer: BLUE CROSS/BLUE SHIELD | Admitting: Family Medicine

## 2018-11-26 DIAGNOSIS — I10 Essential (primary) hypertension: Secondary | ICD-10-CM | POA: Diagnosis not present

## 2018-11-26 DIAGNOSIS — R740 Nonspecific elevation of levels of transaminase and lactic acid dehydrogenase [LDH]: Secondary | ICD-10-CM | POA: Diagnosis not present

## 2018-11-26 DIAGNOSIS — R1010 Upper abdominal pain, unspecified: Secondary | ICD-10-CM

## 2018-11-26 DIAGNOSIS — R7401 Elevation of levels of liver transaminase levels: Secondary | ICD-10-CM

## 2018-11-26 NOTE — Progress Notes (Signed)
Patient ID: Dave Rodriguez, male   DOB: [birth year], 43 y.o.   MRN: [id number]  This visit type was conducted due to national recommendations for restrictions regarding the COVID-19 pandemic in an effort to limit this patient's exposure and mitigate transmission in our community.   Virtual Visit via Video Note  I connected with Dave Rodriguez on 11/26/18 at  2:30 PM EDT by a video enabled telemedicine application and verified that I am speaking with the correct person using two identifiers.  Location patient: home Location provider:work or home office Persons participating in the virtual visit: patient, provider  I discussed the limitations of evaluation and management by telemedicine and the availability of in person appointments. The patient expressed understanding and agreed to proceed.   HPI: Patient seen for ER follow-up.  [place] and on Wednesday around 9 PM had chest versus abdominal pain upper abdominal area.  He had cramp-like sensation.  He has not had any other chest pain episodes.  No recent exertional pain.  He has had some nonspecific symptoms of decreased appetite and some malaise but had been at the beach and outdoors much of the day for 5 days and wondered if some of his fatigue was feeling drained from that.  He had fairly extensive work-up.  He had chest x-ray and EKG which were unremarkable.  Troponin negative.  Multiple labs.  CBC unremarkable.  Electrolytes stable.  ALT 112 and AST 179.  Patient did relate some recent binge drinking.  Sometimes drinking several ounces of vodka per day.  By Friday he felt better.  He then had around 4 AM Saturday some mild diarrhea and those symptoms have improved.  He had some associated abdominal cramps.  No known sick contacts.  No bloody stools.  No hematemesis.  He has hypertension treated with benazepril.  He has hyperlipidemia is on Crestor.  He has reported hemochromatosis.  He had recent ferritin less  than 100 back in March.  Past Medical History:  Diagnosis Date  . Elevated LFTs   . Hemochromatosis   . Hyperlipidemia   . Hypertension    No past surgical history on file.  reports that he has been smoking cigarettes. He has a 1.25 pack-year smoking history. He has never used smokeless tobacco. He reports current alcohol use of about 25.0 - 30.0 standard drinks of alcohol per week. He reports that he does not use drugs. family history includes Colon cancer (age of onset: 57) in his maternal grandmother; Coronary artery disease in his father and unknown relative; Depression in his paternal grandfather; Hyperlipidemia in his father; Hypertension in his father and unknown relative. No Known Allergies    ROS: See pertinent positives and negatives per HPI.  Past Medical History:  Diagnosis Date  . Elevated LFTs   . Hemochromatosis   . Hyperlipidemia   . Hypertension     No past surgical history on file.  Family History  Problem Relation Age of Onset  . Coronary artery disease Unknown   . Hypertension Unknown   . Colon cancer Maternal Grandmother 66  . Hypertension Father   . Hyperlipidemia Father   . Coronary artery disease Father   . Depression Paternal Grandfather     SOCIAL HX: hx of smoking.   Current Outpatient Medications:  .  benazepril (LOTENSIN) 10 MG tablet, Take 1 tablet (10 mg total) by mouth daily., Disp: 90 tablet, Rfl: 3 .  desoximetasone (TOPICORT) 0.25 % cream, Apply  to affected area bid no more than 2 weeks continuously., Disp: 30 g, Rfl: 3 .  rosuvastatin (CRESTOR) 20 MG tablet, Take 1 tablet (20 mg total) by mouth daily., Disp: 90 tablet, Rfl: 3 .  tadalafil (ADCIRCA/CIALIS) 20 MG tablet, Take 0.5-1 tablets (10-20 mg total) by mouth every other day as needed for erectile dysfunction., Disp: 6 tablet, Rfl: 11  EXAM:  VITALS per patient if applicable:  GENERAL: alert, oriented, appears well and in no acute distress  HEENT: atraumatic, conjunttiva  clear, no obvious abnormalities on inspection of external nose and ears  NECK: normal movements of the head and neck  LUNGS: on inspection no signs of respiratory distress, breathing rate appears normal, no obvious gross SOB, gasping or wheezing  CV: no obvious cyanosis  MS: moves all visible extremities without noticeable abnormality  PSYCH/NEURO: pleasant and cooperative, no obvious depression or anxiety, speech and thought processing grossly intact  ASSESSMENT AND PLAN:  Discussed the following assessment and plan:  #1 elevated liver transaminases.  AST greater than ALT.  Suspect related to alcohol.  Doubt related to hemachromatosis.    -Long talk with patient regarding alcohol use.  We have recommended total abstinence and recheck liver follow-up in about a month.  Consider further evaluation then if not improving  #2 recent upper abdominal pain.  He did not describe any hematemesis or melena.  No appetite or weight changes.  Symptoms improved at this time.  He did have some mild diarrhea and question whether this was viral.  We mentioned the fact that some COVID patients have presented with predominantly GI complaints.  He is improved though at this time -follow up immediately for any recurrent symptoms.  #3 health maintenance.  Set up complete physical for after September 9.  Repeat labs then including liver functions and ferritin     I discussed the assessment and treatment plan with the patient. The patient was provided an opportunity to ask questions and all were answered. The patient agreed with the plan and demonstrated an understanding of the instructions.   The patient was advised to call back or seek an in-person evaluation if the symptoms worsen or if the condition fails to improve as anticipated.   Carolann Littler, MD

## 2018-11-27 ENCOUNTER — Other Ambulatory Visit: Payer: Self-pay

## 2018-11-27 MED ORDER — DOXYCYCLINE HYCLATE 100 MG PO TABS: 100.0000 mg | ORAL_TABLET | Freq: Two times a day (BID) | ORAL | 0 refills | Status: DC

## 2019-01-06 ENCOUNTER — Ambulatory Visit (INDEPENDENT_AMBULATORY_CARE_PROVIDER_SITE_OTHER): Payer: BC Managed Care – PPO | Admitting: Family Medicine

## 2019-01-06 ENCOUNTER — Encounter: Payer: Self-pay | Admitting: Family Medicine

## 2019-01-06 ENCOUNTER — Other Ambulatory Visit: Payer: Self-pay

## 2019-01-06 VITALS — BP 142/88 | HR 87 | Temp 98.6°F | Wt 174.0 lb

## 2019-01-06 DIAGNOSIS — Z0000 Encounter for general adult medical examination without abnormal findings: Secondary | ICD-10-CM

## 2019-01-06 DIAGNOSIS — Z23 Encounter for immunization: Secondary | ICD-10-CM | POA: Diagnosis not present

## 2019-01-06 LAB — TSH: TSH: 1.97 u[IU]/mL (ref 0.35–4.50)

## 2019-01-06 LAB — HEPATIC FUNCTION PANEL
ALT: 34 U/L (ref 0–53)
AST: 40 U/L — ABNORMAL HIGH (ref 0–37)
Albumin: 4.9 g/dL (ref 3.5–5.2)
Alkaline Phosphatase: 58 U/L (ref 39–117)
Bilirubin, Direct: 0.1 mg/dL (ref 0.0–0.3)
Total Bilirubin: 0.6 mg/dL (ref 0.2–1.2)
Total Protein: 7 g/dL (ref 6.0–8.3)

## 2019-01-06 LAB — BASIC METABOLIC PANEL
BUN: 8 mg/dL (ref 6–23)
CO2: 27 mEq/L (ref 19–32)
Calcium: 10.3 mg/dL (ref 8.4–10.5)
Chloride: 101 mEq/L (ref 96–112)
Creatinine, Ser: 0.85 mg/dL (ref 0.40–1.50)
GFR: 99 mL/min (ref >60.00)
Glucose, Bld: 85 mg/dL (ref 70–99)
Potassium: 4.4 mEq/L (ref 3.5–5.1)
Sodium: 139 mEq/L (ref 135–145)

## 2019-01-06 LAB — LIPID PANEL
Cholesterol: 153 mg/dL (ref 0–200)
HDL: 59.3 mg/dL (ref >39.00)
LDL Cholesterol: 83 mg/dL (ref 0–99)
NonHDL: 93.32
Total CHOL/HDL Ratio: 3
Triglycerides: 50 mg/dL (ref 0.0–149.0)
VLDL: 10 mg/dL (ref 0.0–40.0)

## 2019-01-06 LAB — FERRITIN: Ferritin: 142.6 ng/mL (ref 22.0–322.0)

## 2019-01-06 MED ORDER — BENAZEPRIL HCL 20 MG PO TABS: 20.0000 mg | ORAL_TABLET | Freq: Every day | ORAL | 3 refills | Status: DC

## 2019-01-06 NOTE — Patient Instructions (Signed)
Go ahead and increase the Benazepril to 20 mg daily  Monitor blood pressure and be in touch if consistently > 140/90.

## 2019-01-06 NOTE — Progress Notes (Signed)
Subjective:     Patient ID: Dave Rodriguez, male   DOB: 1975-06-29, 43 y.o.   MRN: [id number]  HPI Patient seen for physical exam.  He has history of hypertension, psoriasis, hyperlipidemia, ongoing nicotine use, hemochromatosis.  He had recent liver panel which showed elevated AST greater than ALT.  He had been binging somewhat alcohol but states he has not since that time.  He had ferritin level drawn back in March which was below 100.  Recent CBC normal.  He needs tetanus booster.  He declines flu vaccine.  Takes benazepril 10 mg daily for hypertension.  Not monitoring blood pressures closely at home.  Denies any recent chest pains or dyspnea.  No chronic cough.  He does have some chronic allergy symptoms.  Past Medical History:  Diagnosis Date  . Elevated LFTs   . Hemochromatosis   . Hyperlipidemia   . Hypertension    No past surgical history on file.  reports that he has been smoking cigarettes. He has a 1.25 pack-year smoking history. He has never used smokeless tobacco. He reports current alcohol use of about 25.0 - 30.0 standard drinks of alcohol per week. He reports that he does not use drugs. family history includes Colon cancer (age of onset: 7) in his maternal grandmother; Coronary artery disease in his father and unknown relative; Depression in his paternal grandfather; Hyperlipidemia in his father; Hypertension in his father and unknown relative. No Known Allergies   Review of Systems  Constitutional: Negative for activity change, appetite change, fatigue and fever.  HENT: Negative for congestion, ear pain and trouble swallowing.   Eyes: Negative for pain and visual disturbance.  Respiratory: Negative for cough, shortness of breath and wheezing.   Cardiovascular: Negative for chest pain and palpitations.  Gastrointestinal: Negative for abdominal distention, abdominal pain, blood in stool, constipation, diarrhea, nausea, rectal pain and vomiting.  Genitourinary: Negative  for dysuria, hematuria and testicular pain.  Musculoskeletal: Negative for arthralgias and joint swelling.  Skin: Negative for rash.  Neurological: Negative for dizziness, syncope and headaches.  Hematological: Negative for adenopathy.  Psychiatric/Behavioral: Negative for confusion and dysphoric mood.       Objective:   Physical Exam Constitutional:      General: He is not in acute distress.    Appearance: He is well-developed.  HENT:     Head: Normocephalic and atraumatic.     Right Ear: External ear normal.     Left Ear: External ear normal.  Eyes:     Conjunctiva/sclera: Conjunctivae normal.     Pupils: Pupils are equal, round, and reactive to light.  Neck:     Musculoskeletal: Normal range of motion and neck supple.     Thyroid: No thyromegaly.  Cardiovascular:     Rate and Rhythm: Normal rate and regular rhythm.     Heart sounds: Normal heart sounds. No murmur.  Pulmonary:     Effort: No respiratory distress.     Breath sounds: No wheezing or rales.  Abdominal:     General: Bowel sounds are normal. There is no distension.     Palpations: Abdomen is soft. There is no mass.     Tenderness: There is no abdominal tenderness. There is no guarding or rebound.  Musculoskeletal:     Right lower leg: No edema.     Left lower leg: No edema.  Lymphadenopathy:     Cervical: No cervical adenopathy.  Skin:    Findings: No rash.  Neurological:     Mental  Status: He is alert and oriented to person, place, and time.     Cranial Nerves: No cranial nerve deficit.     Deep Tendon Reflexes: Reflexes normal.        Assessment:     Physical exam.  Patient has hypertension which is poorly controlled by today's reading.  Ongoing nicotine use.  Hyperlipidemia treated with Crestor    Plan:     -Tetanus booster given -Flu vaccine recommended but declined -Increase benazepril to 20 mg once daily and monitor blood pressure closely and be in touch if blood pressure readings  consistently greater than 140/90 -Check follow-up labs with ferritin, hepatic panel, basic metabolic panel, lipid panel, TSH -Strongly advised to stop smoking  Eulas Post MD [facility]

## 2019-03-05 ENCOUNTER — Encounter: Payer: Self-pay | Admitting: Family Medicine

## 2019-03-07 ENCOUNTER — Other Ambulatory Visit: Payer: Self-pay

## 2019-03-07 MED ORDER — POLYMYXIN B-TRIMETHOPRIM 10000-0.1 UNIT/ML-% OP SOLN: 2.0000 [drp] | OPHTHALMIC | 0 refills | Status: DC

## 2019-03-19 ENCOUNTER — Other Ambulatory Visit: Payer: Self-pay

## 2019-03-19 ENCOUNTER — Ambulatory Visit (INDEPENDENT_AMBULATORY_CARE_PROVIDER_SITE_OTHER): Payer: BC Managed Care – PPO | Admitting: Family Medicine

## 2019-03-19 ENCOUNTER — Encounter: Payer: Self-pay | Admitting: Family Medicine

## 2019-03-19 VITALS — BP 120/80 | HR 81 | Temp 97.9°F | Wt 177.2 lb

## 2019-03-19 DIAGNOSIS — H18891 Other specified disorders of cornea, right eye: Secondary | ICD-10-CM

## 2019-03-19 NOTE — Progress Notes (Signed)
  Subjective:     Patient ID: Dave Rodriguez, male   DOB: 1975/09/12, 43 y.o.   MRN: [id number]  HPI   Patient called recently with irritation right eye.  He was out of town.  He states he woke up with some discharge in the right eye.  We called in some Polytrim ophthalmic drops after his call on the 11th.  He called back stating this was not any better much.  We at that point recommended an office evaluation.  He is not any blurred vision.  He states he was at the beach recently and wonders if he may have gotten some sand or something else in.  No weed eating or any obvious injury or foreign body to the eye.  No contact use.   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  Minimal pain.  He had recent lab work with physical.  His ferritin came back 142.  He has hemochromatosis.  We had suggested consideration for phlebotomy 500 cc.  Other labs were stable.  Liver transaminases were improved  Past Medical History:  Diagnosis Date  . Elevated LFTs   . Hemochromatosis   . Hyperlipidemia   . Hypertension    History reviewed. No pertinent surgical history.  reports that he has been smoking cigarettes. He has a 1.25 pack-year smoking history. He has never used smokeless tobacco. He reports current alcohol use of about 25.0 - 30.0 standard drinks of alcohol per week. He reports that he does not use drugs. family history includes Colon cancer (age of onset: 32) in his maternal grandmother; Coronary artery disease in his father and another family member; Depression in his paternal grandfather; Hyperlipidemia in his father; Hypertension in his father and another family member. No Known Allergies   Review of Systems  Eyes: Positive for pain. Negative for photophobia, itching and visual disturbance.       Objective:   Physical Exam Vitals signs reviewed.  Constitutional:      Appearance: Normal appearance.  Eyes:     Pupils: Pupils are equal, round, and reactive to  light.     Comments: Left and right conjunctive appear normal.  Pupils equal round reactive to light.  Left eye exam is normal.  Right eye exam reveals small corneal defect along the lower portion of the pupil.  We used fluorescein stain and he has very small vertical oriented linear type defect along the cornea near the lower portion of the pupil  Neurological:     Mental Status: He is alert.        Assessment:     #1 right eye irritation with probable foreign body vs keratitis anterior cornea.  Has had eye irritation now for couple weeks.  No blurred vision  #2 hemochromatosis    Plan:     -We will try to get him into ophthalmologist as soon as possible.  Fortunately, he is not have any signs of obvious clouding of the cornea. -We will need to schedule phlebotomy of 500 cc blood. -we were able to get him in to see physician with [facility] today.    Eulas Post MD [facility]

## 2019-03-19 NOTE — Patient Instructions (Signed)
Eye Foreign Body  An eye foreign body is an object on the surface of the eye or in the eyeball that should not be there. The foreign body may be a small speck of dirt or dust, a hair or eyelash, a splinter, or any other object. A foreign body can injure the eye. It may be found on the outside of the eyeball (extraocular) or inside the eyeball (intraocular). An intraocular foreign body is a medical emergency because it can result in vision loss or loss of the eye. What are the causes? This condition is caused by an object accidentally entering the eye. What are the signs or symptoms? Symptoms of this condition depend on what the foreign body is, and where it is in the eye. Foreign bodies are commonly found:  On the inner surface of the eyelids or on the covering of the white part of the eye (conjunctiva). A foreign body here may cause: ? Pain and irritation, especially when blinking. ? Feeling like something is in the eye. ? Tearing. ? Redness.  On the surface of the clear covering on the front of the eye (cornea). A foreign body here may cause: ? Pain and irritation. ? Small "rust rings" around a metal (metallic) foreign body. ? Feeling like something is in the eye. ? Blurry vision. ? Tearing. ? Redness.  Inside the eyeball. A foreign body here may cause: ? A lot of pain. ? Immediate loss of vision or blurry vision. ? A change in the shape of the black part of the eye (distortion of the pupil). How is this diagnosed? Foreign bodies are found during an exam by an eye specialist.  Those on the eyelids, conjunctiva, or cornea are usually (but not always) easily found.  When a foreign body is inside the eyeball, cloudiness in the lens of the eye (cataract) may form almost right away. This makes it hard for an eye specialist to find the foreign body. You may need tests, such as: ? Ultrasound. ? X-rays. ? CT scan. How is this treated? Foreign bodies on the eyelids, conjunctiva, or  cornea are often removed easily and painlessly. This may be done by washing the eye or using small instruments to take the foreign body out. You may be given numbing (anesthetic) eye drops to relieve pain. Rust in the cornea may require the use of a small, drill-like instrument to remove the rust. If the foreign body caused a scratch or scrape (abrasion) of the cornea, this may be treated with antibiotic drops or ointment. A bandage (eye shield) may be put over your eye. If you have an intraocular foreign body, you will need emergency surgery right away. You may need to be referred to an eye specialist (ophthalmologist) for further treatment. Follow these instructions at home:  Take over-the-counter and prescription medicines only as told by your health care provider. Use eye drops or ointment as directed.  If you were prescribed antibiotic drops or ointment, use it as told by your health care provider. Do not stop using the antibiotic even if your condition improves.  If you have an eye shield: ? Wear it as directed. Follow instructions from your health care provider about when to remove the shield. ? Do not drive or use heavy machinery while wearing the shield.  If you do not have an eye shield: ? Keep your eye closed as much as possible. ? Do not rub your eye. ? Do not wear contact lenses until your eye feels normal  again, or as instructed by your health care provider. ? Wear dark sunglasses as needed to protect your eyes from bright light. ? If you are doing activities with a high risk of eye injury, such as working with high-speed tools, wear protective eye covering.  Keep all follow-up visits as told by your health care provider. This is important. Contact a health care provider if:  You have more pain in your eye.  You have problems with your eye shield.  You have abnormal fluid (discharge) coming from your eye. Get help right away if:  Your vision gets worse.  You have more  redness and swelling in or around your eye. Summary  An eye foreign body is an object on the surface of the eye or in the eyeball that should not be there.  A foreign body can injure the eye. It may be found on the outside of the eyeball (extraocular) or inside the eyeball (intraocular). An intraocular foreign body is a medical emergency.  This condition is caused by objects that accidentally enter the eye. Examples of these objects include dirt, dust, glass, metal, or an eyelash.  Treatment may involve wearing a bandage (eye shield) over your eye. This information is not intended to replace advice given to you by your health care provider. Make sure you discuss any questions you have with your health care provider. Document Released: 04/10/2005 Document Revised: 08/01/2018 Document Reviewed: 05/13/2016 Elsevier Patient Education  2020 [employer].

## 2019-03-26 DIAGNOSIS — B0052 Herpesviral keratitis: Secondary | ICD-10-CM | POA: Diagnosis not present

## 2019-04-23 ENCOUNTER — Telehealth: Payer: Self-pay

## 2019-04-23 NOTE — Telephone Encounter (Signed)
Called and left patient a detailed message to see if he is using [facility]. In [place], [place] [id number]. This pharmacy is not listed on his pharmacy list. We received a refill request for Tadalafil 20 mg tablet We need to know if he is using this pharmacy for this medication.  OK for [facility] to discuss/advise/ and/or schedule patient  CRM Created.

## 2019-04-29 ENCOUNTER — Telehealth (INDEPENDENT_AMBULATORY_CARE_PROVIDER_SITE_OTHER): Payer: BC Managed Care – PPO | Admitting: Family Medicine

## 2019-04-29 ENCOUNTER — Other Ambulatory Visit: Payer: Self-pay

## 2019-04-29 ENCOUNTER — Encounter: Payer: Self-pay | Admitting: Family Medicine

## 2019-04-29 ENCOUNTER — Telehealth: Payer: Self-pay | Admitting: *Deleted

## 2019-04-29 DIAGNOSIS — J019 Acute sinusitis, unspecified: Secondary | ICD-10-CM

## 2019-04-29 MED ORDER — AMOXICILLIN-POT CLAVULANATE 875-125 MG PO TABS: 1.0000 | ORAL_TABLET | Freq: Two times a day (BID) | ORAL | 0 refills | Status: DC

## 2019-04-29 NOTE — Progress Notes (Signed)
This visit type was conducted due to national recommendations for restrictions regarding the COVID-19 pandemic in an effort to limit this patient's exposure and mitigate transmission in our community.   Virtual Visit via Video Note  I connected with Dave Rodriguez on 04/29/19 at  2:15 PM EST by a video enabled telemedicine application and verified that I am speaking with the correct person using two identifiers.  Location patient: home Location provider:work or home office Persons participating in the virtual visit: patient, provider  I discussed the limitations of evaluation and management by telemedicine and the availability of in person appointments. The patient expressed understanding and agreed to proceed.   HPI: Saw has had over 2 weeks of some sinus congestion and now thick yellow nasal mucus.  He is concerned about sinusitis.  He states he has taken Advil Cold and Sinus almost daily for the past couple weeks without much improvement.  He denies any fever, chills, body aches, diarrhea, or any loss of taste or smell.  He states his current symptoms are very typical of sinusitis he has had in the past.  No known sick contacts.  No cough.  No dyspnea.   ROS: See pertinent positives and negatives per HPI.  Past Medical History:  Diagnosis Date  . Elevated LFTs   . Hemochromatosis   . Hyperlipidemia   . Hypertension    No past surgical history on file.  reports that he has been smoking cigarettes. He has a 1.25 pack-year smoking history. He has never used smokeless tobacco. He reports current alcohol use of about 25.0 - 30.0 standard drinks of alcohol per week. He reports that he does not use drugs. family history includes Colon cancer (age of onset: 63) in his maternal grandmother; Coronary artery disease in his father and another family member; Depression in his paternal grandfather; Hyperlipidemia in his father; Hypertension in his father and another family member. No Known  Allergies  SOCIAL HX: History of smoking   EXAM:  VITALS per patient if applicable:  GENERAL: alert, oriented, appears well and in no acute distress  HEENT: atraumatic, conjunttiva clear, no obvious abnormalities on inspection of external nose and ears  NECK: normal movements of the head and neck  LUNGS: on inspection no signs of respiratory distress, breathing rate appears normal, no obvious gross SOB, gasping or wheezing  CV: no obvious cyanosis  MS: moves all visible extremities without noticeable abnormality  PSYCH/NEURO: pleasant and cooperative, no obvious depression or anxiety, speech and thought processing grossly intact  ASSESSMENT AND PLAN:  Discussed the following assessment and plan:  Probable acute sinusitis.  He has had over 2 weeks of symptoms that have been somewhat progressive  -Start Augmentin 875 mg twice daily with food -Stay well-hydrated -Follow-up for any persistent or worsening symptoms     I discussed the assessment and treatment plan with the patient. The patient was provided an opportunity to ask questions and all were answered. The patient agreed with the plan and demonstrated an understanding of the instructions.   The patient was advised to call back or seek an in-person evaluation if the symptoms worsen or if the condition fails to improve as anticipated.    Carolann Littler, MD

## 2019-04-29 NOTE — Telephone Encounter (Signed)
Copied from [place] [id number]. Topic: General - Other >> Apr 29, 2019 12:02 PM Celene Kras wrote: Reason for CRM: Pt called stating he believes he has a sinus infection and is requesting to set up a virtual visit today if possible. Please advise.  Virtual appointment scheduled for patient today at 2:15 PM

## 2019-05-06 ENCOUNTER — Other Ambulatory Visit: Payer: Self-pay

## 2019-05-06 ENCOUNTER — Ambulatory Visit: Payer: BC Managed Care – PPO | Attending: Internal Medicine

## 2019-05-06 DIAGNOSIS — Z20822 Contact with and (suspected) exposure to covid-19: Secondary | ICD-10-CM

## 2019-05-07 ENCOUNTER — Encounter: Payer: Self-pay | Admitting: Family Medicine

## 2019-05-07 ENCOUNTER — Other Ambulatory Visit: Payer: Self-pay

## 2019-05-07 MED ORDER — TADALAFIL 20 MG PO TABS: 10.0000 mg | ORAL_TABLET | ORAL | 11 refills | Status: DC | PRN

## 2019-05-08 LAB — NOVEL CORONAVIRUS, NAA: SARS-CoV-2, NAA: NOT DETECTED

## 2019-05-09 ENCOUNTER — Other Ambulatory Visit: Payer: Self-pay

## 2019-07-02 ENCOUNTER — Other Ambulatory Visit: Payer: Self-pay | Admitting: Family Medicine

## 2019-07-08 ENCOUNTER — Other Ambulatory Visit: Payer: Self-pay

## 2019-07-09 ENCOUNTER — Other Ambulatory Visit: Payer: Self-pay

## 2019-07-14 ENCOUNTER — Other Ambulatory Visit: Payer: Self-pay

## 2020-04-12 ENCOUNTER — Other Ambulatory Visit: Payer: Self-pay

## 2020-04-12 ENCOUNTER — Encounter: Payer: Self-pay | Admitting: Family Medicine

## 2020-04-12 ENCOUNTER — Ambulatory Visit (INDEPENDENT_AMBULATORY_CARE_PROVIDER_SITE_OTHER): Payer: Self-pay | Admitting: Family Medicine

## 2020-04-12 VITALS — BP 140/90 | HR 90 | Ht 70.0 in | Wt 168.0 lb

## 2020-04-12 DIAGNOSIS — E785 Hyperlipidemia, unspecified: Secondary | ICD-10-CM

## 2020-04-12 DIAGNOSIS — Z0000 Encounter for general adult medical examination without abnormal findings: Secondary | ICD-10-CM

## 2020-04-12 MED ORDER — BENAZEPRIL HCL 20 MG PO TABS: 20.0000 mg | ORAL_TABLET | Freq: Every day | ORAL | 3 refills | Status: DC

## 2020-04-12 MED ORDER — ROSUVASTATIN CALCIUM 20 MG PO TABS: 20.0000 mg | ORAL_TABLET | Freq: Every day | ORAL | 3 refills | Status: DC

## 2020-04-12 NOTE — Patient Instructions (Signed)
Preventive Care 41-42 Years Old, Male Preventive care refers to lifestyle choices and visits with your health care provider that can promote health and wellness. This includes:  A yearly physical exam. This is also called an annual well check.  Regular dental and eye exams.  Immunizations.  Screening for certain conditions.  Healthy lifestyle choices, such as eating a healthy diet, getting regular exercise, not using drugs or products that contain nicotine and tobacco, and limiting alcohol use. What can I expect for my preventive care visit? Physical exam Your health care provider will check:  Height and weight. These may be used to calculate body mass index (BMI), which is a measurement that tells if you are at a healthy weight.  Heart rate and blood pressure.  Your skin for abnormal spots. Counseling Your health care provider may ask you questions about:  Alcohol, tobacco, and drug use.  Emotional well-being.  Home and relationship well-being.  Sexual activity.  Eating habits.  Work and work Statistician. What immunizations do I need?  Influenza (flu) vaccine  This is recommended every year. Tetanus, diphtheria, and pertussis (Tdap) vaccine  You may need a Td booster every 10 years. Varicella (chickenpox) vaccine  You may need this vaccine if you have not already been vaccinated. Zoster (shingles) vaccine  You may need this after age 64. Measles, mumps, and rubella (MMR) vaccine  You may need at least one dose of MMR if you were born in 1957 or later. You may also need a second dose. Pneumococcal conjugate (PCV13) vaccine  You may need this if you have certain conditions and were not previously vaccinated. Pneumococcal polysaccharide (PPSV23) vaccine  You may need one or two doses if you smoke cigarettes or if you have certain conditions. Meningococcal conjugate (MenACWY) vaccine  You may need this if you have certain conditions. Hepatitis A  vaccine  You may need this if you have certain conditions or if you travel or work in places where you may be exposed to hepatitis A. Hepatitis B vaccine  You may need this if you have certain conditions or if you travel or work in places where you may be exposed to hepatitis B. Haemophilus influenzae type b (Hib) vaccine  You may need this if you have certain risk factors. Human papillomavirus (HPV) vaccine  If recommended by your health care provider, you may need three doses over 6 months. You may receive vaccines as individual doses or as more than one vaccine together in one shot (combination vaccines). Talk with your health care provider about the risks and benefits of combination vaccines. What tests do I need? Blood tests  Lipid and cholesterol levels. These may be checked every 5 years, or more frequently if you are over 60 years old.  Hepatitis C test.  Hepatitis B test. Screening  Lung cancer screening. You may have this screening every year starting at age 43 if you have a 30-pack-year history of smoking and currently smoke or have quit within the past 15 years.  Prostate cancer screening. Recommendations will vary depending on your family history and other risks.  Colorectal cancer screening. All adults should have this screening starting at age 72 and continuing until age 2. Your health care provider may recommend screening at age 14 if you are at increased risk. You will have tests every 1-10 years, depending on your results and the type of screening test.  Diabetes screening. This is done by checking your blood sugar (glucose) after you have not eaten  for a while (fasting). You may have this done every 1-3 years.  Sexually transmitted disease (STD) testing. Follow these instructions at home: Eating and drinking  Eat a diet that includes fresh fruits and vegetables, whole grains, lean protein, and low-fat dairy products.  Take vitamin and mineral supplements as  recommended by your health care provider.  Do not drink alcohol if your health care provider tells you not to drink.  If you drink alcohol: ? Limit how much you have to 0-2 drinks a day. ? Be aware of how much alcohol is in your drink. In the [place] one drink equals one 12 oz bottle of beer (355 mL), one 5 oz glass of wine (148 mL), or one 1 oz glass of hard liquor (44 mL). Lifestyle  Take daily care of your teeth and gums.  Stay active. Exercise for at least 30 minutes on 5 or more days each week.  Do not use any products that contain nicotine or tobacco, such as cigarettes, e-cigarettes, and chewing tobacco. If you need help quitting, ask your health care provider.  If you are sexually active, practice safe sex. Use a condom or other form of protection to prevent STIs (sexually transmitted infections).  Talk with your health care provider about taking a low-dose aspirin every day starting at age 53. What's next?  Go to your health care provider once a year for a well check visit.  Ask your health care provider how often you should have your eyes and teeth checked.  Stay up to date on all vaccines. This information is not intended to replace advice given to you by your health care provider. Make sure you discuss any questions you have with your health care provider. Document Revised: 04/04/2018 Document Reviewed: 04/04/2018 Elsevier Patient Education  2020 [employer].

## 2020-04-12 NOTE — Progress Notes (Signed)
Established Patient Office Visit  Subjective:  Patient ID: Dave Rodriguez, male    DOB: 04-Jul-1975  Age: 44 y.o. MRN: [id number]  CC:  Chief Complaint  Patient presents with  . Annual Exam    HPI CAPONE SCHWINN presents for physical exam.  He has history of hemochromatosis.  Has not had a ferritin a little over a year.  Still smokes 1 pack cigarettes per day.  He did quit drinking alcohol about 30 days ago does not any alcohol over the past month.  He feels this is becoming more of a problem.  He has been out of his medications which include benazepril and rosuvastatin for the past month and requests refills.  Family history-his father had history of coronary disease in his late 64s and also hyperlipidemia hypertension history.  Mom is alive and well.  He had a maternal grandmother with colon cancer.  No siblings.  Social history-he is married.  Still smokes 1 pack of cigarettes per day.  Recently quit alcohol completely about 35 days ago.  Past Medical History:  Diagnosis Date  . Elevated LFTs   . Hemochromatosis   . Hyperlipidemia   . Hypertension     No past surgical history on file.  Family History  Problem Relation Age of Onset  . Coronary artery disease Other   . Hypertension Other   . Colon cancer Maternal Grandmother 43  . Cancer Maternal Grandmother        colon cancer  . Hypertension Father   . Hyperlipidemia Father   . Coronary artery disease Father   . Heart disease Father 57       MI  . Depression Paternal Grandfather     Social History   Socioeconomic History  . Marital status: Divorced    Spouse name: Not on file  . Number of children: 3  . Years of education: Not on file  . Highest education level: Not on file  Occupational History    Employer: [employer]  Tobacco Use  . Smoking status: Current Every Day Smoker    Packs/day: 0.50    Years: 2.50    Pack years: 1.25    Types: Cigarettes  . Smokeless tobacco: Never Used  Vaping Use   . Vaping Use: Never used  Substance and Sexual Activity  . Alcohol use: Yes    Alcohol/week: 25.0 - 30.0 standard drinks    Types: 25 - 30 Standard drinks or equivalent per week  . Drug use: No  . Sexual activity: Not on file  Other Topics Concern  . Not on file  Social History Narrative  . Not on file   Social Determinants of Health   Financial Resource Strain: Not on file  Food Insecurity: Not on file  Transportation Needs: Not on file  Physical Activity: Not on file  Stress: Not on file  Social Connections: Not on file  Intimate Partner Violence: Not on file    Outpatient Medications Prior to Visit  Medication Sig Dispense Refill  . desoximetasone (TOPICORT) 0.25 % cream Apply to affected area bid no more than 2 weeks continuously. 30 g 3  . tadalafil (CIALIS) 20 MG tablet Take 0.5-1 tablets (10-20 mg total) by mouth every other day as needed for erectile dysfunction. 6 tablet 11  . amoxicillin-clavulanate (AUGMENTIN) 875-125 MG tablet Take 1 tablet by mouth 2 (two) times daily. 20 tablet 0  . benazepril (LOTENSIN) 20 MG tablet Take 1 tablet (20 mg total) by mouth daily.  90 tablet 3  . rosuvastatin (CRESTOR) 20 MG tablet Take 1 tablet (20 mg total) by mouth daily. 90 tablet 3  . trimethoprim-polymyxin b (POLYTRIM) ophthalmic solution Place 2 drops into the right eye every 4 (four) hours. WHILE AWAKE (Patient not taking: Reported on 03/19/2019) 10 mL 0   No facility-administered medications prior to visit.    No Known Allergies  ROS Review of Systems  Constitutional: Negative for activity change, appetite change, fatigue and fever.  HENT: Negative for congestion, ear pain and trouble swallowing.   Eyes: Negative for pain and visual disturbance.  Respiratory: Negative for cough, shortness of breath and wheezing.   Cardiovascular: Negative for chest pain and palpitations.  Gastrointestinal: Negative for abdominal distention, abdominal pain, blood in stool, constipation,  diarrhea, nausea, rectal pain and vomiting.  Endocrine: Negative for polydipsia and polyuria.  Genitourinary: Negative for dysuria, hematuria and testicular pain.  Musculoskeletal: Negative for arthralgias and joint swelling.  Skin: Negative for rash.  Neurological: Negative for dizziness, syncope and headaches.  Hematological: Negative for adenopathy.  Psychiatric/Behavioral: Negative for confusion and dysphoric mood.      Objective:    Physical Exam Constitutional:      General: He is not in acute distress.    Appearance: He is well-developed and well-nourished.  HENT:     Head: Normocephalic and atraumatic.     Right Ear: External ear normal.     Left Ear: External ear normal.     Mouth/Throat:     Mouth: Oropharynx is clear and moist.  Eyes:     Extraocular Movements: EOM normal.     Conjunctiva/sclera: Conjunctivae normal.     Pupils: Pupils are equal, round, and reactive to light.  Neck:     Thyroid: No thyromegaly.  Cardiovascular:     Rate and Rhythm: Normal rate and regular rhythm.     Heart sounds: Normal heart sounds. No murmur heard.   Pulmonary:     Effort: No respiratory distress.     Breath sounds: No wheezing or rales.  Abdominal:     General: Bowel sounds are normal. There is no distension.     Palpations: Abdomen is soft. There is no mass.     Tenderness: There is no abdominal tenderness. There is no guarding or rebound.  Musculoskeletal:        General: No edema.     Cervical back: Normal range of motion and neck supple.     Right lower leg: No edema.     Left lower leg: No edema.  Lymphadenopathy:     Cervical: No cervical adenopathy.  Skin:    Findings: No rash.  Neurological:     Mental Status: He is alert and oriented to person, place, and time.     Cranial Nerves: No cranial nerve deficit.     Deep Tendon Reflexes: Reflexes normal.  Psychiatric:        Mood and Affect: Mood and affect normal.     BP 140/90   Pulse 90   Ht 5\' 10"   (1.778 m)   Wt 168 lb (76.2 kg)   SpO2 98%   BMI 24.11 kg/m  Wt Readings from Last 3 Encounters:  04/12/20 168 lb (76.2 kg)  03/19/19 177 lb 3.2 oz (80.4 kg)  01/06/19 174 lb (78.9 kg)     There are no preventive care reminders to display for this patient.  There are no preventive care reminders to display for this patient.  Lab Results  Component  Value Date   TSH 1.97 01/06/2019   Lab Results  Component Value Date   WBC 4.9 11/20/2018   HGB 14.9 11/20/2018   HCT 42 11/20/2018   MCV 101.7 (H) 08/11/2016   PLT 247 11/20/2018   Lab Results  Component Value Date   NA 139 01/06/2019   K 4.4 01/06/2019   CO2 27 01/06/2019   GLUCOSE 85 01/06/2019   BUN 8 01/06/2019   CREATININE 0.85 01/06/2019   BILITOT 0.6 01/06/2019   ALKPHOS 58 01/06/2019   AST 40 (H) 01/06/2019   ALT 34 01/06/2019   PROT 7.0 01/06/2019   ALBUMIN 4.9 01/06/2019   CALCIUM 10.3 01/06/2019   GFR 99.00 01/06/2019   Lab Results  Component Value Date   CHOL 153 01/06/2019   Lab Results  Component Value Date   HDL 59.30 01/06/2019   Lab Results  Component Value Date   LDLCALC 83 01/06/2019   Lab Results  Component Value Date   TRIG 50.0 01/06/2019   Lab Results  Component Value Date   CHOLHDL 3 01/06/2019   No results found for: HGBA1C    Assessment & Plan:   Problem List Items Addressed This Visit      Unprioritized   Hemochromatosis - Primary   Relevant Orders   Ferritin    Other Visit Diagnoses    Physical exam       Relevant Orders   Basic metabolic panel   Lipid panel   CBC with Differential/Platelet   TSH   Hepatic function panel    -He is encouraged to stop smoking.  Current motivation is low -Recheck labs including ferritin level.  Needs phlebotomy for ferritin over 100 -Refill Crestor and benazepril for 1 year  Meds ordered this encounter  Medications  . DISCONTD: rosuvastatin (CRESTOR) 20 MG tablet    Sig: Take 1 tablet (20 mg total) by mouth daily.     Dispense:  90 tablet    Refill:  3  . DISCONTD: benazepril (LOTENSIN) 20 MG tablet    Sig: Take 1 tablet (20 mg total) by mouth daily.    Dispense:  90 tablet    Refill:  3  . rosuvastatin (CRESTOR) 20 MG tablet    Sig: Take 1 tablet (20 mg total) by mouth daily.    Dispense:  90 tablet    Refill:  3  . benazepril (LOTENSIN) 20 MG tablet    Sig: Take 1 tablet (20 mg total) by mouth daily.    Dispense:  90 tablet    Refill:  3    Follow-up: No follow-ups on file.    Carolann Littler, MD

## 2020-04-12 NOTE — Addendum Note (Signed)
Addended by: Janann Colonel on: 04/12/2020 10:15 AM   Modules accepted: Orders

## 2020-04-13 LAB — BASIC METABOLIC PANEL
BUN: 9 mg/dL (ref 7–25)
CO2: 26 mmol/L (ref 20–32)
Calcium: 10.1 mg/dL (ref 8.6–10.3)
Chloride: 104 mmol/L (ref 98–110)
Creat: 0.86 mg/dL (ref 0.60–1.35)
Glucose, Bld: 94 mg/dL (ref 65–99)
Potassium: 4.8 mmol/L (ref 3.5–5.3)
Sodium: 138 mmol/L (ref 135–146)

## 2020-04-13 LAB — CBC WITH DIFFERENTIAL/PLATELET
Absolute Monocytes: 674 cells/uL (ref 200–950)
Basophils Absolute: 63 cells/uL (ref 0–200)
Basophils Relative: 1 %
Eosinophils Absolute: 101 cells/uL (ref 15–500)
Eosinophils Relative: 1.6 %
HCT: 43.3 % (ref 38.5–50.0)
Hemoglobin: 15.4 g/dL (ref 13.2–17.1)
Lymphs Abs: 1373 cells/uL (ref 850–3900)
MCH: 35.1 pg — ABNORMAL HIGH (ref 27.0–33.0)
MCHC: 35.6 g/dL (ref 32.0–36.0)
MCV: 98.6 fL (ref 80.0–100.0)
MPV: 10.5 fL (ref 7.5–12.5)
Monocytes Relative: 10.7 %
Neutro Abs: 4089 cells/uL (ref 1500–7800)
Neutrophils Relative %: 64.9 %
Platelets: 279 10*3/uL (ref 140–400)
RBC: 4.39 10*6/uL (ref 4.20–5.80)
RDW: 11.6 % (ref 11.0–15.0)
Total Lymphocyte: 21.8 %
WBC: 6.3 10*3/uL (ref 3.8–10.8)

## 2020-04-13 LAB — HEPATIC FUNCTION PANEL
AG Ratio: 2.1 (calc) (ref 1.0–2.5)
ALT: 16 U/L (ref 9–46)
AST: 20 U/L (ref 10–40)
Albumin: 4.4 g/dL (ref 3.6–5.1)
Alkaline phosphatase (APISO): 45 U/L (ref 36–130)
Bilirubin, Direct: 0.1 mg/dL (ref 0.0–0.2)
Globulin: 2.1 g/dL (calc) (ref 1.9–3.7)
Indirect Bilirubin: 0.4 mg/dL (calc) (ref 0.2–1.2)
Total Bilirubin: 0.5 mg/dL (ref 0.2–1.2)
Total Protein: 6.5 g/dL (ref 6.1–8.1)

## 2020-04-13 LAB — TSH: TSH: 2.13 mIU/L (ref 0.40–4.50)

## 2020-04-13 LAB — LIPID PANEL
Cholesterol: 220 mg/dL — ABNORMAL HIGH (ref <200)
HDL: 32 mg/dL — ABNORMAL LOW (ref >=40)
LDL Cholesterol (Calc): 170 mg/dL (calc) — ABNORMAL HIGH
Non-HDL Cholesterol (Calc): 188 mg/dL (calc) — ABNORMAL HIGH (ref <130)
Total CHOL/HDL Ratio: 6.9 (calc) — ABNORMAL HIGH (ref <5.0)
Triglycerides: 77 mg/dL (ref <150)

## 2020-04-13 LAB — FERRITIN: Ferritin: 131 ng/mL (ref 38–380)

## 2020-04-15 NOTE — Addendum Note (Signed)
Addended by: Agnes Lawrence on: 04/15/2020 12:27 PM   Modules accepted: Orders

## 2020-04-24 HISTORY — PX: HERNIA REPAIR: SHX51

## 2020-05-05 ENCOUNTER — Encounter: Payer: Self-pay | Admitting: Family Medicine

## 2020-05-06 ENCOUNTER — Other Ambulatory Visit: Payer: Self-pay

## 2020-05-07 MED ORDER — ROSUVASTATIN CALCIUM 20 MG PO TABS: 20.0000 mg | ORAL_TABLET | Freq: Every day | ORAL | 3 refills | Status: DC

## 2020-05-07 MED ORDER — BENAZEPRIL HCL 20 MG PO TABS: 20.0000 mg | ORAL_TABLET | Freq: Every day | ORAL | 3 refills | Status: DC

## 2020-05-07 MED ORDER — DESOXIMETASONE 0.25 % EX CREA: TOPICAL_CREAM | CUTANEOUS | 3 refills | Status: DC

## 2020-06-03 ENCOUNTER — Other Ambulatory Visit: Payer: Self-pay

## 2020-06-04 ENCOUNTER — Telehealth (INDEPENDENT_AMBULATORY_CARE_PROVIDER_SITE_OTHER): Payer: 59 | Admitting: Family Medicine

## 2020-06-04 ENCOUNTER — Ambulatory Visit: Payer: Self-pay | Admitting: Family Medicine

## 2020-06-04 DIAGNOSIS — R1909 Other intra-abdominal and pelvic swelling, mass and lump: Secondary | ICD-10-CM

## 2020-06-04 NOTE — Progress Notes (Signed)
Virtual Visit via Video Note  I connected with Blake  on 06/04/20 at  4:20 PM EST by a video enabled telemedicine application and verified that I am speaking with the correct person using two identifiers.  Location patient: home, [place] Location provider:work or home office Persons participating in the virtual visit: patient, provider  I discussed the limitations of evaluation and management by telemedicine and the availability of in person appointments. The patient expressed understanding and agreed to proceed.   HPI:  Acute telemedicine visit for ? hernia: -Onset:  Last few weeks -Symptoms include: Intermittent bulge in left groin region about the size of an egg - pops out with coughing and other valsalva type maneuvers, he is able to reduce it himself.  Minimal discomfort. -Denies: Pain, inability to reduce, other symptoms -He was supposed to see his primary care doctor today about this, however he had an emergency pop up at school with his daughter and had to pick her up before his appointment and missed his appointment -He would like referral for surgery consult -Denies any strenuous activities  ROS: See pertinent positives and negatives per HPI.  Past Medical History:  Diagnosis Date  . Elevated LFTs   . Hemochromatosis   . Hyperlipidemia   . Hypertension     No past surgical history on file.   Current Outpatient Medications:  .  benazepril (LOTENSIN) 20 MG tablet, Take 1 tablet (20 mg total) by mouth daily., Disp: 90 tablet, Rfl: 3 .  desoximetasone (TOPICORT) 0.25 % cream, Apply to affected area bid no more than 2 weeks continuously., Disp: 30 g, Rfl: 3 .  rosuvastatin (CRESTOR) 20 MG tablet, Take 1 tablet (20 mg total) by mouth daily., Disp: 90 tablet, Rfl: 3 .  tadalafil (CIALIS) 20 MG tablet, Take 0.5-1 tablets (10-20 mg total) by mouth every other day as needed for erectile dysfunction., Disp: 6 tablet, Rfl: 11  EXAM:  VITALS per patient if applicable:  GENERAL:  alert, oriented, appears well and in no acute distress  GU: Not examined given telemedicine platform restrictions  PSYCH/NEURO: pleasant and cooperative, no obvious depression or anxiety, speech and thought processing grossly intact  ASSESSMENT AND PLAN:  Discussed the following assessment and plan:  Inguinal mass - Plan: Ambulatory referral to General Surgery  -we discussed possible serious and likely etiologies, options for evaluation and workup, limitations of telemedicine visit vs in person visit, treatment, treatment risks and precautions.  Advised in person exam would be best, however he would like a surgery referral to avoid another visit.  Advised of risks, potential complications and precautions. Sent referral through his primary care office.  Advised patient to contact primary care office on Tuesday to check on the status of the referral if he has not heard from their office before then.  Advised to avoid strenuous and aggravating activities until evaluated in person. Advised to seek prompt in person care if worsening, new symptoms arise, pain, bulge not reducible or other concerns.  Discussed options for inperson care if PCP office not available. Did let this patient know that I only do telemedicine on Tuesdays and Thursdays for [facility]. Advised to schedule follow up visit with PCP or [facility] if any further questions or concerns to avoid delays in care. Will copy PCP on this per patient request.    I discussed the assessment and treatment plan with the patient. The patient was provided an opportunity to ask questions and all were answered. The patient agreed with the plan and demonstrated  an understanding of the instructions.     Lucretia Kern, DO

## 2020-06-04 NOTE — Patient Instructions (Addendum)
I sent a referral to the surgeon per your request.  Please contact Dr. Erick Blinks office on Tuesday to check on this referral if they have not contacted you by then.  Seek prompt in person evaluation at Dr. Erick Blinks office or urgent care if this is worsening, you are having pain, any other symptoms develop, you are not able to reduce the bulge or you have other concerns.  I did include some information about hernias below.     Inguinal Hernia, Adult An inguinal hernia is when fat or your intestines push through a weak spot in a muscle where your leg meets your lower belly (groin). This causes a bulge. This kind of hernia could also be:  In your scrotum, if you are male.  In folds of skin around your vagina, if you are male. There are three types of inguinal hernias:  Hernias that can be pushed back into the belly (are reducible). This type rarely causes pain.  Hernias that cannot be pushed back into the belly (are incarcerated).  Hernias that cannot be pushed back into the belly and lose their blood supply (are strangulated). This type needs emergency surgery. What are the causes? This condition is caused by having a weak spot in the muscles or tissues in your groin. This develops over time. The hernia may poke through the weak spot when you strain your lower belly muscles all of a sudden, such as when you:  Lift a heavy object.  Strain to poop (have a bowel movement). Trouble pooping (constipation) can lead to straining.  Cough. What increases the risk? This condition is more likely to develop in:  Males.  Pregnant females.  People who: ? Are overweight. ? Work in jobs that require long periods of standing or heavy lifting. ? Have had an inguinal hernia before. ? Smoke or have lung disease. These factors can lead to long-term (chronic) coughing. What are the signs or symptoms? Symptoms may depend on the size of the hernia. Often, a small hernia has no symptoms.  Symptoms of a larger hernia may include:  A bulge in the groin area. This is easier to see when standing. You might not be able to see it when you are lying down.  Pain or burning in the groin. This may get worse when you lift, strain, or cough.  A dull ache or a feeling of pressure in the groin.  An abnormal bulge in the scrotum, in males. Symptoms of a strangulated inguinal hernia may include:  A bulge in your groin that is very painful and tender to the touch.  A bulge that turns red or purple.  Fever, feeling like you may vomit (nausea), and vomiting.  Not being able to poop or to pass gas. How is this treated? Treatment depends on the size of your hernia and whether you have symptoms. If you do not have symptoms, your doctor may have you watch your hernia carefully and have you come in for follow-up visits. If your hernia is large or if you have symptoms, you may need surgery to repair the hernia. Follow these instructions at home: Lifestyle  Avoid lifting heavy objects.  Avoid standing for long amounts of time.  Do not smoke or use any products that contain nicotine or tobacco. If you need help quitting, ask your doctor.  Stay at a healthy weight. Prevent trouble pooping You may need to take these actions to prevent or treat trouble pooping:  Drink enough fluid to keep your pee (  urine) pale yellow.  Take over-the-counter or prescription medicines.  Eat foods that are high in fiber. These include beans, whole grains, and fresh fruits and vegetables.  Limit foods that are high in fat and sugar. These include fried or sweet foods. General instructions  You may try to push your hernia back in place by very gently pressing on it when you are lying down. Do not try to push the bulge back in if it will not go in easily.  Watch your hernia for any changes in shape, size, or color. Tell your doctor if you see any changes.  Take over-the-counter and prescription medicines  only as told by your doctor.  Keep all follow-up visits. Contact a doctor if:  You have a fever or chills.  You have new symptoms.  Your symptoms get worse. Get help right away if:  You have pain in your groin that gets worse all of a sudden.  You have a bulge in your groin that: ? Gets bigger all of a sudden, and it does not get smaller after that. ? Turns red or purple. ? Is painful when you touch it.  You are a male, and you have: ? Sudden pain in your scrotum. ? A sudden change in the size of your scrotum.  You cannot push the hernia back in place by very gently pressing on it when you are lying down.  You feel like you may vomit, and that feeling does not go away.  You keep vomiting.  You have a fast heartbeat.  You cannot poop or pass gas. These symptoms may be an emergency. Get help right away. Call your local emergency services (911 in the [place]  Do not wait to see if the symptoms will go away.  Do not drive yourself to the hospital. Summary  An inguinal hernia is when fat or your intestines push through a weak spot in a muscle where your leg meets your lower belly (groin). This causes a bulge.  If you do not have symptoms, you may not need treatment. If you have symptoms or a large hernia, you may need surgery.  Avoid lifting heavy objects. Also, avoid standing for long amounts of time.  Do not try to push the bulge back in if it will not go in easily. This information is not intended to replace advice given to you by your health care provider. Make sure you discuss any questions you have with your health care provider. Document Revised: 12/09/2019 Document Reviewed: 12/09/2019 Elsevier Patient Education  2021 [employer].

## 2020-07-22 ENCOUNTER — Other Ambulatory Visit: Payer: Self-pay | Admitting: Family Medicine

## 2020-08-04 ENCOUNTER — Encounter: Payer: Self-pay | Admitting: Family Medicine

## 2020-08-04 MED ORDER — BENAZEPRIL HCL 20 MG PO TABS: 20.0000 mg | ORAL_TABLET | Freq: Every day | ORAL | 3 refills | Status: DC

## 2020-08-04 MED ORDER — ROSUVASTATIN CALCIUM 20 MG PO TABS: 20.0000 mg | ORAL_TABLET | Freq: Every day | ORAL | 3 refills | Status: DC

## 2020-08-13 ENCOUNTER — Other Ambulatory Visit: Payer: Self-pay

## 2020-08-16 ENCOUNTER — Other Ambulatory Visit (INDEPENDENT_AMBULATORY_CARE_PROVIDER_SITE_OTHER): Payer: 59

## 2020-08-16 ENCOUNTER — Other Ambulatory Visit: Payer: Self-pay

## 2020-08-16 DIAGNOSIS — E785 Hyperlipidemia, unspecified: Secondary | ICD-10-CM

## 2020-08-17 LAB — FERRITIN: Ferritin: 80 ng/mL (ref 38–380)

## 2020-08-17 LAB — LIPID PANEL
Cholesterol: 140 mg/dL (ref <200)
HDL: 43 mg/dL (ref >=40)
LDL Cholesterol (Calc): 83 mg/dL (calc)
Non-HDL Cholesterol (Calc): 97 mg/dL (calc) (ref <130)
Total CHOL/HDL Ratio: 3.3 (calc) (ref <5.0)
Triglycerides: 62 mg/dL (ref <150)

## 2021-02-11 ENCOUNTER — Other Ambulatory Visit: Payer: Self-pay | Admitting: Family Medicine

## 2021-02-25 ENCOUNTER — Other Ambulatory Visit: Payer: Self-pay

## 2021-02-25 MED ORDER — TADALAFIL 20 MG PO TABS
10.0000 mg | ORAL_TABLET | ORAL | 2 refills | Status: DC | PRN
Start: 2021-02-25 — End: 2022-11-22

## 2021-04-13 ENCOUNTER — Encounter: Payer: Self-pay | Admitting: Family Medicine

## 2021-04-13 ENCOUNTER — Ambulatory Visit (INDEPENDENT_AMBULATORY_CARE_PROVIDER_SITE_OTHER): Payer: 59 | Admitting: Family Medicine

## 2021-04-13 VITALS — BP 134/84 | HR 85 | Temp 98.7°F | Ht 70.0 in | Wt 157.7 lb

## 2021-04-13 DIAGNOSIS — Z0000 Encounter for general adult medical examination without abnormal findings: Secondary | ICD-10-CM | POA: Diagnosis not present

## 2021-04-13 LAB — LIPID PANEL
Cholesterol: 138 mg/dL (ref 0–200)
HDL: 76.6 mg/dL (ref >39.00)
LDL Cholesterol: 53 mg/dL (ref 0–99)
NonHDL: 61.6
Total CHOL/HDL Ratio: 2
Triglycerides: 44 mg/dL (ref 0.0–149.0)
VLDL: 8.8 mg/dL (ref 0.0–40.0)

## 2021-04-13 LAB — CBC WITH DIFFERENTIAL/PLATELET
Basophils Absolute: 0.1 10*3/uL (ref 0.0–0.1)
Basophils Relative: 1 % (ref 0.0–3.0)
Eosinophils Absolute: 0 10*3/uL (ref 0.0–0.7)
Eosinophils Relative: 0.6 % (ref 0.0–5.0)
HCT: 46.5 % (ref 39.0–52.0)
Hemoglobin: 15.7 g/dL (ref 13.0–17.0)
Lymphocytes Relative: 23.4 % (ref 12.0–46.0)
Lymphs Abs: 1.4 10*3/uL (ref 0.7–4.0)
MCHC: 33.9 g/dL (ref 30.0–36.0)
MCV: 101.7 fl — ABNORMAL HIGH (ref 78.0–100.0)
Monocytes Absolute: 0.8 10*3/uL (ref 0.1–1.0)
Monocytes Relative: 12.4 % — ABNORMAL HIGH (ref 3.0–12.0)
Neutro Abs: 3.8 10*3/uL (ref 1.4–7.7)
Neutrophils Relative %: 62.6 % (ref 43.0–77.0)
Platelets: 307 10*3/uL (ref 150.0–400.0)
RBC: 4.57 Mil/uL (ref 4.22–5.81)
RDW: 13.2 % (ref 11.5–15.5)
WBC: 6.1 10*3/uL (ref 4.0–10.5)

## 2021-04-13 LAB — HEPATIC FUNCTION PANEL
ALT: 93 U/L — ABNORMAL HIGH (ref 0–53)
AST: 117 U/L — ABNORMAL HIGH (ref 0–37)
Albumin: 4.9 g/dL (ref 3.5–5.2)
Alkaline Phosphatase: 74 U/L (ref 39–117)
Bilirubin, Direct: 0.2 mg/dL (ref 0.0–0.3)
Total Bilirubin: 0.8 mg/dL (ref 0.2–1.2)
Total Protein: 7.4 g/dL (ref 6.0–8.3)

## 2021-04-13 LAB — BASIC METABOLIC PANEL
BUN: 8 mg/dL (ref 6–23)
CO2: 27 mEq/L (ref 19–32)
Calcium: 10.1 mg/dL (ref 8.4–10.5)
Chloride: 100 mEq/L (ref 96–112)
Creatinine, Ser: 0.79 mg/dL (ref 0.40–1.50)
GFR: 108.47 mL/min (ref >60.00)
Glucose, Bld: 80 mg/dL (ref 70–99)
Potassium: 4.8 mEq/L (ref 3.5–5.1)
Sodium: 136 mEq/L (ref 135–145)

## 2021-04-13 LAB — FERRITIN: Ferritin: 253.1 ng/mL (ref 22.0–322.0)

## 2021-04-13 LAB — TSH: TSH: 1.46 u[IU]/mL (ref 0.35–5.50)

## 2021-04-13 MED ORDER — BENAZEPRIL HCL 20 MG PO TABS: 20.0000 mg | ORAL_TABLET | Freq: Every day | ORAL | 11 refills | Status: DC

## 2021-04-13 MED ORDER — ROSUVASTATIN CALCIUM 20 MG PO TABS: 20.0000 mg | ORAL_TABLET | Freq: Every day | ORAL | 11 refills | Status: DC

## 2021-04-13 NOTE — Progress Notes (Signed)
Established Patient Office Visit  Subjective:  Patient ID: Dave Rodriguez, male    DOB: Jan 29, 1976  Age: 45 y.o. MRN: [id number]  CC:  Chief Complaint  Patient presents with   Annual Exam    HPI Dave Rodriguez presents for annual physical exam.  He has history of hypertension, hyperlipidemia, hemochromatosis, ongoing nicotine use.  Smokes about a pack cigarettes per day currently with low motivation to quit.  Last ferritin was last April and was 80.  Needs refills of Crestor and Lotensin.  Health maintenance reviewed:  -Previous hepatitis C screen negative -Tetanus due 2030 -Declines flu vaccine -Declines pneumonia vaccine  Family history-his father had coronary disease in his late 41s and also history of hypertension and hyperlipidemia.  Maternal grandmother with colon cancer.  No siblings.  Social history-he has 3 children.  One from prior marriage and 2 from this marriage.  Still smokes 1 pack cigarettes per day.  No alcohol use since last year.   Past Medical History:  Diagnosis Date   Elevated LFTs    Hemochromatosis    Hyperlipidemia    Hypertension     No past surgical history on file.  Family History  Problem Relation Age of Onset   Coronary artery disease Other    Hypertension Other    Colon cancer Maternal Grandmother 52   Cancer Maternal Grandmother        colon cancer   Hypertension Father    Hyperlipidemia Father    Coronary artery disease Father    Heart disease Father 63       MI   Depression Paternal Grandfather     Social History   Socioeconomic History   Marital status: Married    Spouse name: Not on file   Number of children: 3   Years of education: Not on file   Highest education level: Not on file  Occupational History    Employer: [employer]  Tobacco Use   Smoking status: Every Day    Packs/day: 0.50    Years: 2.50    Pack years: 1.25    Types: Cigarettes   Smokeless tobacco: Never  Vaping Use   Vaping Use: Never  used  Substance and Sexual Activity   Alcohol use: Yes    Alcohol/week: 25.0 - 30.0 standard drinks    Types: 25 - 30 Standard drinks or equivalent per week   Drug use: No   Sexual activity: Not on file  Other Topics Concern   Not on file  Social History Narrative   Not on file   Social Determinants of Health   Financial Resource Strain: Not on file  Food Insecurity: Not on file  Transportation Needs: Not on file  Physical Activity: Not on file  Stress: Not on file  Social Connections: Not on file  Intimate Partner Violence: Not on file    Outpatient Medications Prior to Visit  Medication Sig Dispense Refill   desoximetasone (TOPICORT) 0.25 % cream Apply to affected area bid no more than 2 weeks continuously. 30 g 3   tadalafil (CIALIS) 20 MG tablet Take 0.5-1 tablets (10-20 mg total) by mouth every other day as needed for erectile dysfunction. 6 tablet 2   benazepril (LOTENSIN) 20 MG tablet Take 1 tablet (20 mg total) by mouth daily. 90 tablet 3   rosuvastatin (CRESTOR) 20 MG tablet Take 1 tablet (20 mg total) by mouth daily. 90 tablet 3   No facility-administered medications prior to visit.    No  Known Allergies  ROS Review of Systems  Constitutional:  Negative for activity change, appetite change, fatigue and fever.  HENT:  Negative for congestion, ear pain and trouble swallowing.   Eyes:  Negative for pain and visual disturbance.  Respiratory:  Negative for cough, shortness of breath and wheezing.   Cardiovascular:  Negative for chest pain and palpitations.  Gastrointestinal:  Negative for abdominal distention, abdominal pain, blood in stool, constipation, diarrhea, nausea, rectal pain and vomiting.  Endocrine: Negative for polydipsia and polyuria.  Genitourinary:  Negative for dysuria, hematuria and testicular pain.  Musculoskeletal:  Negative for arthralgias and joint swelling.  Skin:  Negative for rash.  Neurological:  Negative for dizziness, syncope and  headaches.  Hematological:  Negative for adenopathy.  Psychiatric/Behavioral:  Negative for confusion and dysphoric mood.      Objective:    Physical Exam Constitutional:      General: He is not in acute distress.    Appearance: He is well-developed.  HENT:     Head: Normocephalic and atraumatic.     Right Ear: External ear normal.     Left Ear: External ear normal.  Eyes:     Conjunctiva/sclera: Conjunctivae normal.     Pupils: Pupils are equal, round, and reactive to light.  Neck:     Thyroid: No thyromegaly.  Cardiovascular:     Rate and Rhythm: Normal rate and regular rhythm.     Heart sounds: Normal heart sounds. No murmur heard. Pulmonary:     Effort: No respiratory distress.     Breath sounds: No wheezing or rales.  Abdominal:     General: Bowel sounds are normal. There is no distension.     Palpations: Abdomen is soft. There is no mass.     Tenderness: There is no abdominal tenderness. There is no guarding or rebound.  Musculoskeletal:     Cervical back: Normal range of motion and neck supple.     Right lower leg: No edema.     Left lower leg: No edema.  Lymphadenopathy:     Cervical: No cervical adenopathy.  Skin:    Findings: No rash.  Neurological:     Mental Status: He is alert and oriented to person, place, and time.     Cranial Nerves: No cranial nerve deficit.     Deep Tendon Reflexes: Reflexes normal.    BP 134/84 (BP Location: Left Arm, Patient Position: Sitting, Cuff Size: Normal)    Pulse 85    Temp 98.7 F (37.1 C) (Oral)    Ht 5\' 10"  (1.778 m)    Wt 157 lb 11.2 oz (71.5 kg)    SpO2 (!) 9%    BMI 22.63 kg/m  Wt Readings from Last 3 Encounters:  04/13/21 157 lb 11.2 oz (71.5 kg)  04/12/20 168 lb (76.2 kg)  03/19/19 177 lb 3.2 oz (80.4 kg)     Health Maintenance Due  Topic Date Due   COVID-19 Vaccine (3 - Booster for Pfizer series) 02/11/2020    There are no preventive care reminders to display for this patient.  Lab Results  Component  Value Date   TSH 2.13 04/12/2020   Lab Results  Component Value Date   WBC 6.3 04/12/2020   HGB 15.4 04/12/2020   HCT 43.3 04/12/2020   MCV 98.6 04/12/2020   PLT 279 04/12/2020   Lab Results  Component Value Date   NA 138 04/12/2020   K 4.8 04/12/2020   CO2 26 04/12/2020   GLUCOSE 94  04/12/2020   BUN 9 04/12/2020   CREATININE 0.86 04/12/2020   BILITOT 0.5 04/12/2020   ALKPHOS 58 01/06/2019   AST 20 04/12/2020   ALT 16 04/12/2020   PROT 6.5 04/12/2020   ALBUMIN 4.9 01/06/2019   CALCIUM 10.1 04/12/2020   GFR 99.00 01/06/2019   Lab Results  Component Value Date   CHOL 140 08/16/2020   Lab Results  Component Value Date   HDL 43 08/16/2020   Lab Results  Component Value Date   LDLCALC 83 08/16/2020   Lab Results  Component Value Date   TRIG 62 08/16/2020   Lab Results  Component Value Date   CHOLHDL 3.3 08/16/2020   No results found for: HGBA1C    Assessment & Plan:   Problem List Items Addressed This Visit       Unprioritized   Hemochromatosis   Relevant Orders   Ferritin   Other Visit Diagnoses     Physical exam    -  Primary   Relevant Orders   Basic metabolic panel   Lipid panel   CBC with Differential/Platelet   TSH   Hepatic function panel     He has lost substantial weight from last year and he states this is related to quitting alcohol.  He still has good appetite and feels that his current weight loss is related to lifestyle change.  -Strongly advocated stopping smoking though his current motivation is low -Refilled his regular medications for 1 year -Obtain screening labs as above including ferritin -We discussed colon cancer screening at age 61 -Offered flu vaccine and Pneumovax and he declines -We discussed coronary calcium score consideration with his strong family history of CAD in father.  He was given information on this and will consider  Meds ordered this encounter  Medications   benazepril (LOTENSIN) 20 MG tablet     Sig: Take 1 tablet (20 mg total) by mouth daily.    Dispense:  30 tablet    Refill:  11   rosuvastatin (CRESTOR) 20 MG tablet    Sig: Take 1 tablet (20 mg total) by mouth daily.    Dispense:  30 tablet    Refill:  11    Follow-up: No follow-ups on file.    Carolann Littler, MD

## 2021-05-03 ENCOUNTER — Telehealth: Payer: Self-pay | Admitting: Family Medicine

## 2021-05-03 NOTE — Telephone Encounter (Signed)
Patient brought in paperwork that he needs Dr.Burchette to complete. Paperwork would be placed in folder.  Paperwork could be faxed to [phone] when complete.  Please advise.

## 2021-05-04 NOTE — Telephone Encounter (Signed)
Form has been placed in Dr. Anastasio Auerbach red folder.

## 2021-05-05 NOTE — Telephone Encounter (Signed)
Paperwork has been faxed. Left a detailed message on verified voice mail informing the patient.

## 2021-05-10 ENCOUNTER — Encounter: Payer: Self-pay | Admitting: Family Medicine

## 2021-05-31 ENCOUNTER — Encounter: Payer: Self-pay | Admitting: Family Medicine

## 2021-06-01 NOTE — Telephone Encounter (Signed)
Referral placed for Ct coronary calcium    he also need phlebotomy for recent elevated ferritin .

## 2021-06-02 ENCOUNTER — Other Ambulatory Visit: Payer: Self-pay | Admitting: Family Medicine

## 2021-06-02 ENCOUNTER — Other Ambulatory Visit: Payer: 59

## 2021-06-02 ENCOUNTER — Other Ambulatory Visit: Payer: Self-pay

## 2021-06-02 DIAGNOSIS — Z8249 Family history of ischemic heart disease and other diseases of the circulatory system: Secondary | ICD-10-CM

## 2021-06-14 ENCOUNTER — Encounter: Payer: Self-pay | Admitting: Family Medicine

## 2021-07-26 ENCOUNTER — Other Ambulatory Visit: Payer: Self-pay | Admitting: Family Medicine

## 2021-07-26 ENCOUNTER — Ambulatory Visit
Admission: RE | Admit: 2021-07-26 | Discharge: 2021-07-26 | Disposition: A | Discharge status: Home / Self Care | Payer: Self-pay | Source: Ambulatory Visit | Attending: Family Medicine | Admitting: Family Medicine

## 2021-07-26 DIAGNOSIS — Z8249 Family history of ischemic heart disease and other diseases of the circulatory system: Secondary | ICD-10-CM

## 2021-07-26 DIAGNOSIS — R931 Abnormal findings on diagnostic imaging of heart and coronary circulation: Secondary | ICD-10-CM | POA: Insufficient documentation

## 2021-07-27 NOTE — Progress Notes (Addendum)
?Cardiology Office Note:   ? ?Date:  07/28/2021  ? ?ID:  Dave Rodriguez, DOB 01/29/1976, MRN [id number] ? ?PCP:  Eulas Post, MD  ? ?[facility] ?Cardiologist:  Lenna Sciara, MD ?Referring MD: Eulas Post, MD  ? ?Chief Complaint/Reason for Referral: Elevated coronary calcium score ? ?ASSESSMENT:   ? ?1. Coronary artery calcification seen on CAT scan   ?2. Aortic atherosclerosis ([place])   ?3. Hypertension, unspecified type   ?4. Hyperlipidemia, unspecified hyperlipidemia type   ?5. Tobacco use disorder   ? ? ?PLAN:   ? ?In order of problems listed above: ?1.  Start aspirin 81 mg, and continue statin with goal LDL of less than 70.  Follow-up in 1 year or earlier if needed. ?2.  Aspirin, statin, and strict blood pressure control. ?3.  Diastolic BP is elevated, monitor for now.  If elevated next time he sees a provider and his benazepril should be intensified ?4.  Lipid panel in December was at goal with LDL of 53. ?5.  T he has no real plans to quit smoking but I counseled him that this is the best thing that he can do. ? ? ?     ? ?   ? ?Dispo:  Return in about 1 year (around 07/29/2022).  ? ?  ? ?Medication Adjustments/Labs and Tests Ordered: ?Current medicines are reviewed at length with the patient today.  Concerns regarding medicines are outlined above. ? ?The following changes have been made:    ? ?Labs/tests ordered: ?Orders Placed This Encounter  ?Procedures  ? EKG 12-Lead  ? ? ?Medication Changes: ?Meds ordered this encounter  ?Medications  ? aspirin EC 81 MG tablet  ?  Sig: Take 1 tablet (81 mg total) by mouth daily. Swallow whole.  ?  Dispense:  30 tablet  ?  Refill:  11  ? ? ? ?Current medicines are reviewed at length with the patient today.  The patient does not have concerns regarding medicines. ? ? ?History of Present Illness:   ? ?FOCUSED PROBLEM LIST:   ?1.  Elevated coronary artery calcium right circumflex CT 2023 ?2.  Aortic atherosclerosis on CT 2023 ?3.  Hyperlipidemia ?4.   Hypertension ?5.  Hemochromatosis ?6.  Family history of early coronary artery disease ?7.  Tobacco abuse ? ?The patient is a 46 y.o. male with the indicated medical history here for recommendations regarding elevated coronary calcium score.  The patient was seen by his primary care provider in December.  At that point in time he was doing relatively well.  Due to his family history he was referred for calcium score which was found to be elevated. ? ?The patient is doing well.  He denies any exertional angina, exertional dyspnea, presyncope, syncope, peripheral edema, paroxysmal atrial dyspnea, orthopnea.  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  He is required no emergency room visits or hospitalizations.  He is compliant with his medications.  He unfortunately continues to smoke and has no real plans to quit. ? ?   ? ? ?Current Medications: ?Current Meds  ?Medication Sig  ? aspirin EC 81 MG tablet Take 1 tablet (81 mg total) by mouth daily. Swallow whole.  ? benazepril (LOTENSIN) 20 MG tablet Take 1 tablet (20 mg total) by mouth daily.  ? desoximetasone (TOPICORT) 0.25 % cream Apply to affected area bid no more than 2 weeks continuously.  ? rosuvastatin (CRESTOR) 20 MG tablet Take 1 tablet (20 mg  total) by mouth daily.  ? tadalafil (CIALIS) 20 MG tablet Take 0.5-1 tablets (10-20 mg total) by mouth every other day as needed for erectile dysfunction.  ?  ? ?Allergies:    ?Patient has no known allergies.  ? ?Social History:   ?Social History  ? ?Tobacco Use  ? Smoking status: Every Day  ?  Packs/day: 0.50  ?  Years: 2.50  ?  Pack years: 1.25  ?  Types: Cigarettes  ? Smokeless tobacco: Never  ?Vaping Use  ? Vaping Use: Never used  ?Substance Use Topics  ? Alcohol use: Yes  ?  Alcohol/week: 25.0 - 30.0 standard drinks  ?  Types: 25 - 30 Standard drinks or equivalent per week  ? Drug use: No  ?  ? ?Family Hx: ?Family History  ?Problem Relation Age of Onset  ? Coronary artery  disease Other   ? Hypertension Other   ? Colon cancer Maternal Grandmother 36  ? Cancer Maternal Grandmother   ?     colon cancer  ? Hypertension Father   ? Hyperlipidemia Father   ? Coronary artery disease Father   ? Heart disease Father 33  ?     MI  ? Depression Paternal Grandfather   ?  ? ?Review of Systems:   ?Please see the history of present illness.    ?All other systems reviewed and are negative. ?  ? ? ?EKGs/Labs/Other Test Reviewed:   ? ?EKG:  EKG is ordered today that I personally reviewed demonstrates sinus rhythm. ? ? ?Prior CV studies: ? ?CT 2023 ?Left main: 0 ?Left anterior descending artery: 788 ?Left circumflex artery: 52 ?Right coronary artery: 940 ?Total: 4431 ?Percentile: 99th ?  ?Aorta: Normal caliber of ascending aorta. Aortic atherosclerosis noted ?IMPRESSION: ?Coronary calcium score of 1780. This was 99th percentile for age-, ?race-, and sex-matched controls. ? ?Other studies Reviewed: ?Review of the additional studies/records demonstrates: Abdominal ultrasound 2014 negative ? ?Recent Labs: ?04/13/2021: ALT 93; BUN 8; Creatinine, Ser 0.79; Hemoglobin 15.7; Platelets 307.0; Potassium 4.8; Sodium 136; TSH 1.46  ? ?Recent Lipid Panel ?Lab Results  ?Component Value Date/Time  ? CHOL 138 04/13/2021 09:47 AM  ? TRIG 44.0 04/13/2021 09:47 AM  ? HDL 76.60 04/13/2021 09:47 AM  ? LDLCALC 53 04/13/2021 09:47 AM  ? LDLCALC 83 08/16/2020 09:41 AM  ? LDLDIRECT 81.0 10/13/2016 07:55 AM  ? ? ?Risk Assessment/Calculations:   ? ?  ?    ? ?Physical Exam:   ? ?VS:  BP 128/90   Pulse 72   Ht '5\' 10"'$  (1.778 m)   Wt 163 lb (73.9 kg)   SpO2 97%   BMI 23.39 kg/m?    ?Wt Readings from Last 3 Encounters:  ?07/28/21 163 lb (73.9 kg)  ?04/13/21 157 lb 11.2 oz (71.5 kg)  ?04/12/20 168 lb (76.2 kg)  ?  ?GENERAL:  No apparent distress, AOx3 ?HEENT:  No carotid bruits, +2 carotid impulses, no scleral icterus ?CAR: RRR no murmurs, gallops, rubs, or thrills ?RES:  Clear to auscultation bilaterally ?ABD:  Soft, nontender,  nondistended, positive bowel sounds x 4 ?VASC:  +2 radial pulses, +2 carotid pulses, palpable pedal pulses ?NEURO:  CN 2-12 grossly intact; motor and sensory grossly intact ?PSYCH:  No active depression or anxiety ?EXT:  No edema, ecchymosis, or cyanosis ? ?Signed, ?Early Osmond, MD  ?07/28/2021 2:23 PM    ?[facility] [address], [place], [place]  [address] ?Phone: [phone]; Fax: [phone]  ? ?  Note:  This document was prepared using Dragon voice recognition software and may include unintentional dictation errors. ?

## 2021-07-28 ENCOUNTER — Encounter: Payer: Self-pay | Admitting: Internal Medicine

## 2021-07-28 ENCOUNTER — Ambulatory Visit (INDEPENDENT_AMBULATORY_CARE_PROVIDER_SITE_OTHER): Payer: 59 | Admitting: Internal Medicine

## 2021-07-28 VITALS — BP 128/90 | HR 72 | Ht 70.0 in | Wt 163.0 lb

## 2021-07-28 DIAGNOSIS — I10 Essential (primary) hypertension: Secondary | ICD-10-CM | POA: Diagnosis not present

## 2021-07-28 DIAGNOSIS — I700 Atherosclerosis of aorta: Secondary | ICD-10-CM

## 2021-07-28 DIAGNOSIS — I2510 Atherosclerotic heart disease of native coronary artery without angina pectoris: Secondary | ICD-10-CM

## 2021-07-28 DIAGNOSIS — E785 Hyperlipidemia, unspecified: Secondary | ICD-10-CM

## 2021-07-28 DIAGNOSIS — F17200 Nicotine dependence, unspecified, uncomplicated: Secondary | ICD-10-CM

## 2021-07-28 MED ORDER — ASPIRIN EC 81 MG PO TBEC: 81.0000 mg | DELAYED_RELEASE_TABLET | Freq: Every day | ORAL | 11 refills | Status: AC

## 2021-07-28 NOTE — Patient Instructions (Addendum)
Medication Instructions:  ?Your physician has recommended you make the following change in your medication:  ?1-START Aspirin 81 mg by mouth daily. ? ?*If you need a refill on your cardiac medications before your next appointment, please call your pharmacy* ? ?Lab Work: ?If you have labs (blood work) drawn today and your tests are completely normal, you will receive your results only by: ?MyChart Message (if you have MyChart) OR ?A paper copy in the mail ?If you have any lab test that is abnormal or we need to change your treatment, we will call you to review the results. ? ?Testing/Procedures: ?None ordered today. ? ?Follow-Up: ?At [facility], you and your health needs are our priority.  As part of our continuing mission to provide you with exceptional heart care, we have created designated Provider Care Teams.  These Care Teams include your primary Cardiologist (physician) and Advanced Practice Providers (APPs -  Physician Assistants and Nurse Practitioners) who all work together to provide you with the care you need, when you need it. ? ?We recommend signing up for the patient portal called "MyChart".  Sign up information is provided on this After Visit Summary.  MyChart is used to connect with patients for Virtual Visits (Telemedicine).  Patients are able to view lab/test results, encounter notes, upcoming appointments, etc.  Non-urgent messages can be sent to your provider as well.   ?To learn more about what you can do with MyChart, go to [email or web address].   ? ?Your next appointment:   ?1 year(s) ? ?The format for your next appointment:   ?In Person ? ?Provider:   ?Early Osmond, MD { ? ? ? ?

## 2021-11-08 ENCOUNTER — Encounter: Payer: Self-pay | Admitting: *Deleted

## 2021-11-08 DIAGNOSIS — Z006 Encounter for examination for normal comparison and control in clinical research program: Secondary | ICD-10-CM

## 2021-11-08 NOTE — Research (Addendum)
I called patient about Dave Rodriguez-Prevention Study. We discussed the study. Patient wants me to get approval from his primary doctor about the study. If his primary doctor approves,he will consider the research study. I will send him e-mail with consent for him to review.  I verified with Dr. Elease Hashimoto that he would be fine with patient participating in the study. I checked patient's last LDL which is 53 and that is an exclusion for the study.I called patient to let him know and left message on his voicemail.

## 2022-05-11 ENCOUNTER — Other Ambulatory Visit: Payer: Self-pay | Admitting: Family Medicine

## 2022-05-22 ENCOUNTER — Ambulatory Visit (INDEPENDENT_AMBULATORY_CARE_PROVIDER_SITE_OTHER): Payer: PRIVATE HEALTH INSURANCE | Admitting: Family Medicine

## 2022-05-22 ENCOUNTER — Encounter: Payer: Self-pay | Admitting: Family Medicine

## 2022-05-22 VITALS — BP 120/80 | HR 78 | Temp 98.9°F | Ht 70.87 in | Wt 160.1 lb

## 2022-05-22 DIAGNOSIS — E785 Hyperlipidemia, unspecified: Secondary | ICD-10-CM

## 2022-05-22 DIAGNOSIS — R7989 Other specified abnormal findings of blood chemistry: Secondary | ICD-10-CM

## 2022-05-22 DIAGNOSIS — Z0000 Encounter for general adult medical examination without abnormal findings: Secondary | ICD-10-CM | POA: Diagnosis not present

## 2022-05-22 LAB — BASIC METABOLIC PANEL
BUN: 9 mg/dL (ref 6–23)
CO2: 26 mEq/L (ref 19–32)
Calcium: 9.8 mg/dL (ref 8.4–10.5)
Chloride: 102 mEq/L (ref 96–112)
Creatinine, Ser: 0.72 mg/dL (ref 0.40–1.50)
GFR: 110.69 mL/min (ref >60.00)
Glucose, Bld: 84 mg/dL (ref 70–99)
Potassium: 3.9 mEq/L (ref 3.5–5.1)
Sodium: 139 mEq/L (ref 135–145)

## 2022-05-22 LAB — CBC WITH DIFFERENTIAL/PLATELET
Basophils Absolute: 0.1 10*3/uL (ref 0.0–0.1)
Basophils Relative: 1.7 % (ref 0.0–3.0)
Eosinophils Absolute: 0.1 10*3/uL (ref 0.0–0.7)
Eosinophils Relative: 3.7 % (ref 0.0–5.0)
HCT: 39.7 % (ref 39.0–52.0)
Hemoglobin: 14 g/dL (ref 13.0–17.0)
Lymphocytes Relative: 30.1 % (ref 12.0–46.0)
Lymphs Abs: 1.2 10*3/uL (ref 0.7–4.0)
MCHC: 35.2 g/dL (ref 30.0–36.0)
MCV: 101.6 fl — ABNORMAL HIGH (ref 78.0–100.0)
Monocytes Absolute: 0.5 10*3/uL (ref 0.1–1.0)
Monocytes Relative: 14.2 % — ABNORMAL HIGH (ref 3.0–12.0)
Neutro Abs: 2 10*3/uL (ref 1.4–7.7)
Neutrophils Relative %: 50.3 % (ref 43.0–77.0)
Platelets: 236 10*3/uL (ref 150.0–400.0)
RBC: 3.91 Mil/uL — ABNORMAL LOW (ref 4.22–5.81)
RDW: 12.6 % (ref 11.5–15.5)
WBC: 3.9 10*3/uL — ABNORMAL LOW (ref 4.0–10.5)

## 2022-05-22 LAB — HEPATIC FUNCTION PANEL
ALT: 123 U/L — ABNORMAL HIGH (ref 0–53)
AST: 159 U/L — ABNORMAL HIGH (ref 0–37)
Albumin: 4.8 g/dL (ref 3.5–5.2)
Alkaline Phosphatase: 46 U/L (ref 39–117)
Bilirubin, Direct: 0.1 mg/dL (ref 0.0–0.3)
Total Bilirubin: 0.4 mg/dL (ref 0.2–1.2)
Total Protein: 7 g/dL (ref 6.0–8.3)

## 2022-05-22 LAB — LIPID PANEL
Cholesterol: 132 mg/dL (ref 0–200)
HDL: 62.3 mg/dL (ref >39.00)
LDL Cholesterol: 61 mg/dL (ref 0–99)
NonHDL: 69.35
Total CHOL/HDL Ratio: 2
Triglycerides: 40 mg/dL (ref 0.0–149.0)
VLDL: 8 mg/dL (ref 0.0–40.0)

## 2022-05-22 LAB — FERRITIN: Ferritin: 348.7 ng/mL — ABNORMAL HIGH (ref 22.0–322.0)

## 2022-05-22 LAB — TSH: TSH: 1.63 u[IU]/mL (ref 0.35–5.50)

## 2022-05-22 LAB — HEMOGLOBIN A1C: Hgb A1c MFr Bld: 5.2 % (ref 4.6–6.5)

## 2022-05-22 MED ORDER — ROSUVASTATIN CALCIUM 20 MG PO TABS: ORAL_TABLET | ORAL | 3 refills | Status: DC

## 2022-05-22 MED ORDER — BENAZEPRIL HCL 20 MG PO TABS: ORAL_TABLET | ORAL | 3 refills | Status: DC

## 2022-05-22 MED ORDER — DESOXIMETASONE 0.25 % EX CREA: TOPICAL_CREAM | CUTANEOUS | 3 refills | Status: AC

## 2022-05-22 NOTE — Progress Notes (Signed)
Established Patient Office Visit  Subjective   Patient ID: Dave Rodriguez, male    DOB: 10-03-1975  Age: 47 y.o. MRN: [id number]  No chief complaint on file.   HPI   Dave Rodriguez is seen for physical exam.  He has history of hypertension hyperlipidemia.  Extremely high coronary calcium score of 1780 last year which is 99th percentile for age.  He was seen by cardiology.  Advised to start baby aspirin 1 daily.  He remains on Crestor 20 mg daily.  Last LDL cholesterol was 53.  He still smokes about half pack cigarettes per day.  He does have history of mild elevated liver transaminases.  Does not drink alcohol daily.  Has scaled back some.  History of hemochromatosis.  He states he donated blood through the [employer] last year but never went for formal phlebotomy through 1 blood.  Overdue for follow-up ferritin.  Maintenance reviewed:  -Just turned 47.  No history of colonoscopy. -Declines flu vaccine -Tetanus due 2030 -Prior hepatitis C screen negative  Family history-his father had coronary disease in his late 21s and also history of hypertension and hyperlipidemia.  Maternal grandmother with colon cancer.  No siblings.   Social history-he has 3 children.  One from prior marriage and 2 from this marriage.  Still smokes 1 pack cigarettes per day.  No alcohol use since last year.  Currently works for [employer] out of [place].  Past Medical History:  Diagnosis Date   Elevated LFTs    Hemochromatosis    Hyperlipidemia    Hypertension    History reviewed. No pertinent surgical history.  reports that he has been smoking cigarettes. He has a 1.25 pack-year smoking history. He has never used smokeless tobacco. He reports current alcohol use of about 25.0 - 30.0 standard drinks of alcohol per week. He reports that he does not use drugs. family history includes Cancer in his maternal grandmother; Colon cancer (age of onset: 68) in his maternal grandmother; Coronary artery  disease in his father and another family member; Depression in his paternal grandfather; Heart disease (age of onset: 62) in his father; Hyperlipidemia in his father; Hypertension in his father and another family member. No Known Allergies  Review of Systems  Constitutional:  Negative for chills, fever, malaise/fatigue and weight loss.  HENT:  Negative for hearing loss.   Eyes:  Negative for blurred vision and double vision.  Respiratory:  Negative for cough and shortness of breath.   Cardiovascular:  Negative for chest pain, palpitations and leg swelling.  Gastrointestinal:  Negative for abdominal pain, blood in stool, constipation and diarrhea.  Genitourinary:  Negative for dysuria.  Skin:  Negative for rash.  Neurological:  Negative for dizziness, speech change, seizures, loss of consciousness and headaches.  Psychiatric/Behavioral:  Negative for depression.       Objective:     BP 120/80 (BP Location: Left Arm, Cuff Size: Normal)   Pulse 78   Temp 98.9 F (37.2 C) (Oral)   Ht 5' 10.87" (1.8 m)   Wt 160 lb 1.6 oz (72.6 kg)   SpO2 99%   BMI 22.41 kg/m  BP Readings from Last 3 Encounters:  05/22/22 120/80  07/28/21 128/90  04/13/21 134/84   Wt Readings from Last 3 Encounters:  05/22/22 160 lb 1.6 oz (72.6 kg)  07/28/21 163 lb (73.9 kg)  04/13/21 157 lb 11.2 oz (71.5 kg)      Physical Exam Vitals reviewed.  Constitutional:  General: He is not in acute distress.    Appearance: He is well-developed.  HENT:     Head: Normocephalic and atraumatic.     Ears:     Comments: He has some cerumen in both canals but nonobstructing Eyes:     Pupils: Pupils are equal, round, and reactive to light.  Neck:     Thyroid: No thyromegaly.  Cardiovascular:     Rate and Rhythm: Normal rate and regular rhythm.     Heart sounds: Normal heart sounds. No murmur heard. Pulmonary:     Effort: No respiratory distress.     Breath sounds: No wheezing or rales.  Abdominal:      General: Bowel sounds are normal. There is no distension.     Palpations: Abdomen is soft. There is no mass.     Tenderness: There is no abdominal tenderness. There is no guarding or rebound.  Musculoskeletal:     Cervical back: Normal range of motion and neck supple.     Right lower leg: No edema.     Left lower leg: No edema.  Lymphadenopathy:     Cervical: No cervical adenopathy.  Skin:    Findings: No rash.  Neurological:     Mental Status: He is alert and oriented to person, place, and time.     Cranial Nerves: No cranial nerve deficit.      No results found for any visits on 05/22/22.    The 10-year ASCVD risk score (Arnett DK, et al., 2019) is: 1.8%    Assessment & Plan:   Problem List Items Addressed This Visit       Unprioritized   Hyperlipidemia   Relevant Medications   benazepril (LOTENSIN) 20 MG tablet   rosuvastatin (CRESTOR) 20 MG tablet   Other Relevant Orders   Lipid panel   Hemochromatosis - Primary   Relevant Orders   Ferritin   Other Visit Diagnoses     Physical exam       Relevant Orders   Basic metabolic panel   Lipid panel   CBC with Differential/Platelet   TSH   Hepatic function panel   Hemoglobin A1c   Ambulatory referral to Gastroenterology     -Refilled regular medications for 1 year -Offered flu vaccine but declined -Check labs as above.  Add ferritin with history of hemochromatosis. -We need to get his ferritin down below 100.  If this remains elevated we will need to either schedule phlebotomy or consider referral to hematology to have them help manage. -Encouraged to stop smoking. -Set up initial screening colonoscopy.  He is advised to check to make sure he has insurance coverage as well. -History of very high coronary calcium score.  Needs to continue with aggressive cholesterol management with goal LDL less than 55.  Strongly advised to stop smoking  No follow-ups on file.    Carolann Littler, MD

## 2022-05-23 ENCOUNTER — Encounter: Payer: Self-pay | Admitting: Gastroenterology

## 2022-05-24 ENCOUNTER — Telehealth: Payer: Self-pay | Admitting: Family Medicine

## 2022-05-24 NOTE — Telephone Encounter (Signed)
Pt called, returning CMA's call. CMA was unavailable. Pt asked that CMA call back at their earliest convenience. 

## 2022-05-24 NOTE — Telephone Encounter (Signed)
Please see result note 

## 2022-05-24 NOTE — Addendum Note (Signed)
Addended by: Nilda Riggs on: 05/24/2022 09:58 AM   Modules accepted: Orders

## 2022-06-15 ENCOUNTER — Other Ambulatory Visit: Payer: Self-pay | Admitting: *Deleted

## 2022-06-15 DIAGNOSIS — R7989 Other specified abnormal findings of blood chemistry: Secondary | ICD-10-CM

## 2022-06-19 NOTE — Progress Notes (Unsigned)
New Hematology/Oncology Consult   Requesting MD: Dr. Carolann Littler  [phone]      Reason for Consult: Elevated ferritin, homozygous for H63D gene mutation  HPI: Dave Rodriguez is a 47 year old man referred for evaluation of an elevated ferritin.  He is homozygous for the H63D gene mutation.  He has had phlebotomy procedures in the past.  He saw Dr. Elease Hashimoto on 05/22/2022 for a physical exam.  Ferritin returned elevated at 348.     Past Medical History:  Diagnosis Date   Elevated LFTs    Hemochromatosis    Hyperlipidemia    Hypertension     No past surgical history on file.:   Current Outpatient Medications:    aspirin EC 81 MG tablet, Take 1 tablet (81 mg total) by mouth daily. Swallow whole., Disp: 30 tablet, Rfl: 11   benazepril (LOTENSIN) 20 MG tablet, TAKE 1 TABLET(20 MG) BY MOUTH DAILY, Disp: 90 tablet, Rfl: 3   desoximetasone (TOPICORT) 0.25 % cream, Apply to affected area bid no more than 2 weeks continuously., Disp: 30 g, Rfl: 3   rosuvastatin (CRESTOR) 20 MG tablet, TAKE 1 TABLET(20 MG) BY MOUTH DAILY, Disp: 90 tablet, Rfl: 3   tadalafil (CIALIS) 20 MG tablet, Take 0.5-1 tablets (10-20 mg total) by mouth every other day as needed for erectile dysfunction., Disp: 6 tablet, Rfl: 2:    No Known Allergies:  FH:  SOCIAL HISTORY:  Review of Systems:  Physical Exam:  There were no vitals taken for this visit.  HEENT: *** Lungs: *** Cardiac: *** Abdomen: *** GU: ***  Vascular: *** Lymph nodes: *** Neurologic: *** Skin: *** Musculoskeletal: ***  LABS:  No results for input(s): "WBC", "HGB", "HCT", "PLT" in the last 72 hours.  No results for input(s): "NA", "K", "CL", "CO2", "GLUCOSE", "BUN", "CREATININE", "CALCIUM" in the last 72 hours.    RADIOLOGY:  No results found.  Assessment and Plan:   Elevated ferritin Homozygous H63D gene mutation History of elevated LFTs    Dave Card, NP 06/19/2022, 4:35 PM

## 2022-06-20 ENCOUNTER — Inpatient Hospital Stay: Payer: 59 | Attending: Nurse Practitioner

## 2022-06-20 ENCOUNTER — Inpatient Hospital Stay (HOSPITAL_BASED_OUTPATIENT_CLINIC_OR_DEPARTMENT_OTHER): Payer: 59 | Admitting: Nurse Practitioner

## 2022-06-20 ENCOUNTER — Encounter: Payer: Self-pay | Admitting: Nurse Practitioner

## 2022-06-20 ENCOUNTER — Telehealth: Payer: Self-pay | Admitting: Family Medicine

## 2022-06-20 VITALS — BP 140/90 | HR 73 | Temp 98.1°F | Resp 18 | Ht 70.0 in | Wt 165.3 lb

## 2022-06-20 DIAGNOSIS — F1090 Alcohol use, unspecified, uncomplicated: Secondary | ICD-10-CM | POA: Diagnosis not present

## 2022-06-20 DIAGNOSIS — R7401 Elevation of levels of liver transaminase levels: Secondary | ICD-10-CM | POA: Insufficient documentation

## 2022-06-20 DIAGNOSIS — Z800 Family history of malignant neoplasm of digestive organs: Secondary | ICD-10-CM

## 2022-06-20 DIAGNOSIS — R7989 Other specified abnormal findings of blood chemistry: Secondary | ICD-10-CM | POA: Diagnosis not present

## 2022-06-20 DIAGNOSIS — Z79899 Other long term (current) drug therapy: Secondary | ICD-10-CM | POA: Diagnosis not present

## 2022-06-20 DIAGNOSIS — F17210 Nicotine dependence, cigarettes, uncomplicated: Secondary | ICD-10-CM | POA: Diagnosis not present

## 2022-06-20 LAB — CBC WITH DIFFERENTIAL (CANCER CENTER ONLY)
Abs Immature Granulocytes: 0.02 10*3/uL (ref 0.00–0.07)
Basophils Absolute: 0.1 10*3/uL (ref 0.0–0.1)
Basophils Relative: 1 %
Eosinophils Absolute: 0.1 10*3/uL (ref 0.0–0.5)
Eosinophils Relative: 2 %
HCT: 42 % (ref 39.0–52.0)
Hemoglobin: 15 g/dL (ref 13.0–17.0)
Immature Granulocytes: 0 %
Lymphocytes Relative: 29 %
Lymphs Abs: 2 10*3/uL (ref 0.7–4.0)
MCH: 35.5 pg — ABNORMAL HIGH (ref 26.0–34.0)
MCHC: 35.7 g/dL (ref 30.0–36.0)
MCV: 99.3 fL (ref 80.0–100.0)
Monocytes Absolute: 0.7 10*3/uL (ref 0.1–1.0)
Monocytes Relative: 10 %
Neutro Abs: 4.1 10*3/uL (ref 1.7–7.7)
Neutrophils Relative %: 58 %
Platelet Count: 263 10*3/uL (ref 150–400)
RBC: 4.23 MIL/uL (ref 4.22–5.81)
RDW: 11.7 % (ref 11.5–15.5)
WBC Count: 7 10*3/uL (ref 4.0–10.5)
nRBC: 0 % (ref 0.0–0.2)

## 2022-06-20 LAB — CMP (CANCER CENTER ONLY)
ALT: 61 U/L — ABNORMAL HIGH (ref 0–44)
AST: 64 U/L — ABNORMAL HIGH (ref 15–41)
Albumin: 5.1 g/dL — ABNORMAL HIGH (ref 3.5–5.0)
Alkaline Phosphatase: 43 U/L (ref 38–126)
Anion gap: 8 (ref 5–15)
BUN: 10 mg/dL (ref 6–20)
CO2: 28 mmol/L (ref 22–32)
Calcium: 10.2 mg/dL (ref 8.9–10.3)
Chloride: 103 mmol/L (ref 98–111)
Creatinine: 0.69 mg/dL (ref 0.61–1.24)
GFR, Estimated: >60 mL/min (ref >60)
Glucose, Bld: 86 mg/dL (ref 70–99)
Potassium: 4.9 mmol/L (ref 3.5–5.1)
Sodium: 139 mmol/L (ref 135–145)
Total Bilirubin: 0.4 mg/dL (ref 0.3–1.2)
Total Protein: 7.1 g/dL (ref 6.5–8.1)

## 2022-06-20 LAB — SAVE SMEAR(SSMR), FOR PROVIDER SLIDE REVIEW

## 2022-06-20 LAB — FERRITIN: Ferritin: 156 ng/mL (ref 24–336)

## 2022-06-20 NOTE — Telephone Encounter (Signed)
[facility] Data to be filled out, placed in dr's folder.  Call pt upon completion.

## 2022-06-23 ENCOUNTER — Encounter: Payer: Self-pay | Admitting: Gastroenterology

## 2022-06-23 ENCOUNTER — Ambulatory Visit (AMBULATORY_SURGERY_CENTER): Payer: PRIVATE HEALTH INSURANCE

## 2022-06-23 VITALS — Ht 70.0 in | Wt 157.0 lb

## 2022-06-23 DIAGNOSIS — Z1211 Encounter for screening for malignant neoplasm of colon: Secondary | ICD-10-CM

## 2022-06-23 MED ORDER — NA SULFATE-K SULFATE-MG SULF 17.5-3.13-1.6 GM/177ML PO SOLN: 1.0000 | Freq: Once | ORAL | 0 refills | Status: AC

## 2022-06-23 NOTE — Progress Notes (Signed)
No egg or soy allergy known to patient   No issues known to pt with past sedation with any surgeries or procedures  Patient denies ever being told they had issues or difficulty with intubation   No FH of Malignant Hyperthermia  Pt is not on diet pills  Pt is not on  home 02   Pt is not on blood thinners   Pt denies issues with constipation   No A fib or A flutter  Have any cardiac testing pending--no Pt instructed to use Singlecare.com or GoodRx for a price reduction on prep   

## 2022-06-26 NOTE — Telephone Encounter (Signed)
Pt said his hips are 19 inches and waist 35 1/2

## 2022-06-26 NOTE — Telephone Encounter (Signed)
Forms have been completed by PCP and faxed

## 2022-07-18 ENCOUNTER — Encounter: Payer: Self-pay | Admitting: Gastroenterology

## 2022-07-18 ENCOUNTER — Ambulatory Visit (AMBULATORY_SURGERY_CENTER): Payer: 59 | Admitting: Gastroenterology

## 2022-07-18 VITALS — BP 129/89 | HR 82 | Temp 98.9°F | Resp 16 | Ht 70.0 in | Wt 157.0 lb

## 2022-07-18 DIAGNOSIS — Z1211 Encounter for screening for malignant neoplasm of colon: Secondary | ICD-10-CM | POA: Diagnosis present

## 2022-07-18 MED ORDER — SODIUM CHLORIDE 0.9 % IV SOLN: 500.0000 mL | Freq: Once | INTRAVENOUS | Status: DC

## 2022-07-18 NOTE — Patient Instructions (Signed)
   Handouts on diverticulosis,hemorrhoids & high fiber diet given to you today   Follow high fiber diet    YOU HAD AN ENDOSCOPIC PROCEDURE TODAY AT [facility]:   Refer to the procedure report that was given to you for any specific questions about what was found during the examination.  If the procedure report does not answer your questions, please call your gastroenterologist to clarify.  If you requested that your care partner not be given the details of your procedure findings, then the procedure report has been included in a sealed envelope for you to review at your convenience later.  YOU SHOULD EXPECT: Some feelings of bloating in the abdomen. Passage of more gas than usual.  Walking can help get rid of the air that was put into your GI tract during the procedure and reduce the bloating. If you had a lower endoscopy (such as a colonoscopy or flexible sigmoidoscopy) you may notice spotting of blood in your stool or on the toilet paper. If you underwent a bowel prep for your procedure, you may not have a normal bowel movement for a few days.  Please Note:  You might notice some irritation and congestion in your nose or some drainage.  This is from the oxygen used during your procedure.  There is no need for concern and it should clear up in a day or so.  SYMPTOMS TO REPORT IMMEDIATELY:  Following lower endoscopy (colonoscopy or flexible sigmoidoscopy):  Excessive amounts of blood in the stool  Significant tenderness or worsening of abdominal pains  Swelling of the abdomen that is new, acute  Fever of 100F or higher   For urgent or emergent issues, a gastroenterologist can be reached at any hour by calling [phone]. Do not use MyChart messaging for urgent concerns.    DIET:  We do recommend a small meal at first, but then you may proceed to your regular diet.  Drink plenty of fluids but you should avoid alcoholic beverages for 24 hours.  ACTIVITY:  You should  plan to take it easy for the rest of today and you should NOT DRIVE or use heavy machinery until tomorrow (because of the sedation medicines used during the test).    FOLLOW UP: Our staff will call the number listed on your records the next business day following your procedure.  We will call around 7:15- 8:00 am to check on you and address any questions or concerns that you may have regarding the information given to you following your procedure. If we do not reach you, we will leave a message.     If any biopsies were taken you will be contacted by phone or by letter within the next 1-3 weeks.  Please call us at [phone] if you have not heard about the biopsies in 3 weeks.    SIGNATURES/CONFIDENTIALITY: You and/or your care partner have signed paperwork which will be entered into your electronic medical record.  These signatures attest to the fact that that the information above on your After Visit Summary has been reviewed and is understood.  Full responsibility of the confidentiality of this discharge information lies with you and/or your care-partner.

## 2022-07-18 NOTE — Op Note (Signed)
[facility] Patient Name: Dave Rodriguez Procedure Date: 07/18/2022 9:07 AM MRN: [id number] Endoscopist: Jackquline Denmark , MD, [id number] Age: 47 Referring MD:  Date of Birth: Jul 31, 1975 Gender: Male Account #: [id number] Procedure:                Colonoscopy Indications:              Screening for colorectal malignant neoplasm (FH CRC                            MGM) Medicines:                Monitored Anesthesia Care Procedure:                Pre-Anesthesia Assessment:                           - Prior to the procedure, a History and Physical                            was performed, and patient medications and                            allergies were reviewed. The patient's tolerance of                            previous anesthesia was also reviewed. The risks                            and benefits of the procedure and the sedation                            options and risks were discussed with the patient.                            All questions were answered, and informed consent                            was obtained. Prior Anticoagulants: The patient has                            taken no anticoagulant or antiplatelet agents. ASA                            Grade Assessment: I - A normal, healthy patient.                            After reviewing the risks and benefits, the patient                            was deemed in satisfactory condition to undergo the                            procedure.  After obtaining informed consent, the colonoscope                            was passed under direct vision. Throughout the                            procedure, the patient's blood pressure, pulse, and                            oxygen saturations were monitored continuously. The                            Olympus PCF-H190DL [id number]) Colonoscope was                            introduced through the anus and advanced to the 2                             cm into the ileum. The colonoscopy was performed                            without difficulty. The patient tolerated the                            procedure well. The quality of the bowel                            preparation was good. The terminal ileum, ileocecal                            valve, appendiceal orifice, and rectum were                            photographed. Scope In: 9:11:19 AM Scope Out: 9:25:12 AM Scope Withdrawal Time: 0 hours 9 minutes 45 seconds  Total Procedure Duration: 0 hours 13 minutes 53 seconds  Findings:                 Multiple medium-mouthed diverticula were found in                            the sigmoid colon.                           Non-bleeding internal hemorrhoids were found during                            retroflexion. The hemorrhoids were small.                           The terminal ileum appeared normal.                           The exam was otherwise without abnormality on  direct and retroflexion views. Complications:            No immediate complications. Estimated Blood Loss:     Estimated blood loss: none. Impression:               - Moderate sigmoid diverticulosis.                           - Non-bleeding internal hemorrhoids.                           - The examined portion of the ileum was normal.                           - The examination was otherwise normal on direct                            and retroflexion views.                           - No specimens collected.                           - The GI Genius (intelligent endoscopy module),                            computer-aided polyp detection system powered by AI                            was utilized to detect colorectal polyps through                            enhanced visualization during colonoscopy. Recommendation:           - Patient has a contact number available for                            emergencies. The signs and symptoms  of potential                            delayed complications were discussed with the                            patient. Return to normal activities tomorrow.                            Written discharge instructions were provided to the                            patient.                           - High fiber diet.                           - Continue present medications.                           -  Repeat colonoscopy in 10 years for screening                            purposes. Earlier, if with any new problems or                            change in family history.                           - The findings and recommendations were discussed                            with the patient's family. Jackquline Denmark, MD 07/18/2022 9:29:36 AM This report has been signed electronically.

## 2022-07-18 NOTE — Progress Notes (Signed)
Pt's states no medical or surgical changes since previsit or office visit. 

## 2022-07-18 NOTE — Progress Notes (Signed)
Uneventful anesthetic. Report to pacu rn. Vss. Care resumed by rn. 

## 2022-07-18 NOTE — Progress Notes (Signed)
[facility] History and Physical   Primary Care Physician:  Eulas Post, MD   Reason for Procedure:   CRC screening  Plan:    colon     HPI: Dave Rodriguez is a 47 y.o. male    Past Medical History:  Diagnosis Date   Allergy    Elevated LFTs    Hemochromatosis    Hyperlipidemia    Hypertension     Past Surgical History:  Procedure Laterality Date   HERNIA REPAIR Bilateral 2022    Prior to Admission medications   Medication Sig Start Date End Date Taking? Authorizing Provider  aspirin EC 81 MG tablet Take 1 tablet (81 mg total) by mouth daily. Swallow whole. 07/28/21  Yes Early Osmond, MD  benazepril (LOTENSIN) 20 MG tablet TAKE 1 TABLET(20 MG) BY MOUTH DAILY 05/22/22  Yes Burchette, Alinda Sierras, MD  rosuvastatin (CRESTOR) 20 MG tablet TAKE 1 TABLET(20 MG) BY MOUTH DAILY 05/22/22  Yes Burchette, Alinda Sierras, MD  desoximetasone (TOPICORT) 0.25 % cream Apply to affected area bid no more than 2 weeks continuously. 05/22/22   Burchette, Alinda Sierras, MD  tadalafil (CIALIS) 20 MG tablet Take 0.5-1 tablets (10-20 mg total) by mouth every other day as needed for erectile dysfunction. 02/25/21   Burchette, Alinda Sierras, MD    Current Outpatient Medications  Medication Sig Dispense Refill   aspirin EC 81 MG tablet Take 1 tablet (81 mg total) by mouth daily. Swallow whole. 30 tablet 11   benazepril (LOTENSIN) 20 MG tablet TAKE 1 TABLET(20 MG) BY MOUTH DAILY 90 tablet 3   rosuvastatin (CRESTOR) 20 MG tablet TAKE 1 TABLET(20 MG) BY MOUTH DAILY 90 tablet 3   desoximetasone (TOPICORT) 0.25 % cream Apply to affected area bid no more than 2 weeks continuously. 30 g 3   tadalafil (CIALIS) 20 MG tablet Take 0.5-1 tablets (10-20 mg total) by mouth every other day as needed for erectile dysfunction. 6 tablet 2   Current Facility-Administered Medications  Medication Dose Route Frequency Provider Last Rate Last Admin   0.9 %  sodium chloride infusion  500 mL Intravenous Once Jackquline Denmark,  MD        Allergies as of 07/18/2022   (No Known Allergies)    Family History  Problem Relation Age of Onset   Hypertension Father    Hyperlipidemia Father    Coronary artery disease Father    Heart disease Father 11       MI   Colon cancer Maternal Grandmother 56   Cancer Maternal Grandmother        colon cancer   Depression Paternal Grandfather    Coronary artery disease Other    Hypertension Other    Colon polyps Neg Hx    Esophageal cancer Neg Hx    Rectal cancer Neg Hx    Stomach cancer Neg Hx     Social History   Socioeconomic History   Marital status: Married    Spouse name: Not on file   Number of children: 3   Years of education: Not on file   Highest education level: Not on file  Occupational History    Employer: [employer]  Tobacco Use   Smoking status: Every Day    Packs/day: 0.50    Years: 2.50    Additional pack years: 0.00    Total pack years: 1.25    Types: Cigarettes   Smokeless tobacco: Never  Vaping Use   Vaping Use: Never used  Substance  and Sexual Activity   Alcohol use: Yes    Alcohol/week: 25.0 - 30.0 standard drinks of alcohol    Types: 25 - 30 Standard drinks or equivalent per week   Drug use: No   Sexual activity: Not on file  Other Topics Concern   Not on file  Social History Narrative   Not on file   Social Determinants of Health   Financial Resource Strain: Low Risk  (06/20/2022)   Overall Financial Resource Strain (CARDIA)    Difficulty of Paying Living Expenses: Not hard at all  Food Insecurity: No Food Insecurity (06/20/2022)   Hunger Vital Sign    Worried About Running Out of Food in the Last Year: Never true    Ran Out of Food in the Last Year: Never true  Transportation Needs: No Transportation Needs (06/20/2022)   PRAPARE - Hydrologist (Medical): No    Lack of Transportation (Non-Medical): No  Physical Activity: Not on file  Stress: No Stress Concern Present (06/20/2022)    [facility]    Feeling of Stress : Only a little  Social Connections: Not on file  Intimate Partner Violence: Not At Risk (06/20/2022)   Humiliation, Afraid, Rape, and Kick questionnaire    Fear of Current or Ex-Partner: No    Emotionally Abused: No    Physically Abused: No    Sexually Abused: No    Review of Systems: Positive for none All other review of systems negative except as mentioned in the HPI.  Physical Exam: Vital signs in last 24 hours: @VSRANGES @   General:   Alert,  Well-developed, well-nourished, pleasant and cooperative in NAD Lungs:  Clear throughout to auscultation.   Heart:  Regular rate and rhythm; no murmurs, clicks, rubs,  or gallops. Abdomen:  Soft, nontender and nondistended. Normal bowel sounds.   Neuro/Psych:  Alert and cooperative. Normal mood and affect. A and O x 3    No significant changes were identified.  The patient continues to be an appropriate candidate for the planned procedure and anesthesia.   Carmell Austria, MD. [facility] 07/18/2022 8:59 AM@

## 2022-07-19 ENCOUNTER — Telehealth: Payer: Self-pay

## 2022-07-19 NOTE — Telephone Encounter (Signed)
Left HIPAA-compliant voicemail

## 2022-10-19 ENCOUNTER — Inpatient Hospital Stay: Payer: 59

## 2022-10-19 ENCOUNTER — Inpatient Hospital Stay: Payer: 59 | Attending: Oncology | Admitting: Oncology

## 2022-11-22 ENCOUNTER — Other Ambulatory Visit: Payer: Self-pay | Admitting: Family Medicine

## 2023-02-14 ENCOUNTER — Encounter: Payer: Self-pay | Admitting: Family Medicine

## 2023-02-22 ENCOUNTER — Encounter: Payer: Self-pay | Admitting: Internal Medicine

## 2023-04-23 ENCOUNTER — Telehealth: Payer: 59 | Admitting: Family Medicine

## 2023-04-23 DIAGNOSIS — B9689 Other specified bacterial agents as the cause of diseases classified elsewhere: Secondary | ICD-10-CM

## 2023-04-23 DIAGNOSIS — J019 Acute sinusitis, unspecified: Secondary | ICD-10-CM

## 2023-04-23 MED ORDER — AMOXICILLIN-POT CLAVULANATE 875-125 MG PO TABS: 1.0000 | ORAL_TABLET | Freq: Two times a day (BID) | ORAL | 0 refills | Status: AC

## 2023-04-23 NOTE — Progress Notes (Signed)
Virtual Visit Consent   Dave Rodriguez, you are scheduled for a virtual visit with a [facility] provider today. Just as with appointments in the office, your consent must be obtained to participate. Your consent will be active for this visit and any virtual visit you may have with one of our providers in the next 365 days. If you have a MyChart account, a copy of this consent can be sent to you electronically.  As this is a virtual visit, video technology does not allow for your provider to perform a traditional examination. This may limit your provider's ability to fully assess your condition. If your provider identifies any concerns that need to be evaluated in person or the need to arrange testing (such as labs, EKG, etc.), we will make arrangements to do so. Although advances in technology are sophisticated, we cannot ensure that it will always work on either your end or our end. If the connection with a video visit is poor, the visit may have to be switched to a telephone visit. With either a video or telephone visit, we are not always able to ensure that we have a secure connection.  By engaging in this virtual visit, you consent to the provision of healthcare and authorize for your insurance to be billed (if applicable) for the services provided during this visit. Depending on your insurance coverage, you may receive a charge related to this service.  I need to obtain your verbal consent now. Are you willing to proceed with your visit today? Dave Rodriguez has provided verbal consent on 04/23/2023 for a virtual visit (video or telephone). Freddy Finner, NP  Date: 04/23/2023 3:28 PM  Virtual Visit via Video Note   I, Freddy Finner, connected with  Dave Rodriguez  ([id number], Sep 21, 1975) on 04/23/23 at  3:30 PM EST by a video-enabled telemedicine application and verified that I am speaking with the correct person using two identifiers.  Location: Patient: Virtual Visit Location Patient:  Home Provider: Virtual Visit Location Provider: Home Office   I discussed the limitations of evaluation and management by telemedicine and the availability of in person appointments. The patient expressed understanding and agreed to proceed.    History of Present Illness: Dave Rodriguez is a 47 y.o. who identifies as a male who was assigned male at birth, and is being seen today for   Onset was last week with congestion, cough,  Associated symptoms are head congestion- and pressure Modifying factors are nothing Denies chest pain, shortness of breath, fevers, chills, headaches,  Exposure to sick contacts- known- family is sick as well  COVID test: neg Vaccines: no   Problems:  Patient Active Problem List   Diagnosis Date Noted   High coronary artery calcium score 07/26/2021   Psoriasis 08/11/2016   ETOH abuse 07/18/2013   Hemochromatosis 03/06/2013   Tobacco use disorder 02/26/2013   Hyperlipidemia 04/12/2007   Essential hypertension 04/12/2007    Allergies: No Known Allergies Medications:  Current Outpatient Medications:    aspirin EC 81 MG tablet, Take 1 tablet (81 mg total) by mouth daily. Swallow whole., Disp: 30 tablet, Rfl: 11   benazepril (LOTENSIN) 20 MG tablet, TAKE 1 TABLET(20 MG) BY MOUTH DAILY, Disp: 90 tablet, Rfl: 3   desoximetasone (TOPICORT) 0.25 % cream, Apply to affected area bid no more than 2 weeks continuously., Disp: 30 g, Rfl: 3   rosuvastatin (CRESTOR) 20 MG tablet, TAKE 1 TABLET(20 MG) BY MOUTH DAILY, Disp: 90 tablet, Rfl:  3   tadalafil (CIALIS) 20 MG tablet, TAKE 1/2 TO 1 TABLET(10 TO 20 MG) BY MOUTH EVERY OTHER DAY AS NEEDED FOR ERECTILE DYSFUNCTION, Disp: 6 tablet, Rfl: 11  Observations/Objective: Patient is well-developed, well-nourished in no acute distress.  Resting comfortably  at home.  Head is normocephalic, atraumatic.  No labored breathing.  Speech is clear and coherent with logical content.  Patient is alert and oriented at baseline.   Congestion tone    Assessment and Plan:  1. Acute bacterial sinusitis (Primary)  - amoxicillin-clavulanate (AUGMENTIN) 875-125 MG tablet; Take 1 tablet by mouth 2 (two) times daily for 7 days.  Dispense: 14 tablet; Refill: 0   URI recommendations: - Increased rest - Increasing Fluids - Acetaminophen / ibuprofen as needed for fever/pain.  - Salt water gargling, chloraseptic spray and throat lozenges - Mucinex if mucus is present and increasing.  - Saline nasal spray if congestion or if nasal passages feel dry. - Humidifying the air.   Reviewed side effects, risks and benefits of medication.    Patient acknowledged agreement and understanding of the plan.   Past Medical, Surgical, Social History, Allergies, and Medications have been Reviewed.   Follow Up Instructions: I discussed the assessment and treatment plan with the patient. The patient was provided an opportunity to ask questions and all were answered. The patient agreed with the plan and demonstrated an understanding of the instructions.  A copy of instructions were sent to the patient via MyChart unless otherwise noted below.    The patient was advised to call back or seek an in-person evaluation if the symptoms worsen or if the condition fails to improve as anticipated.    Freddy Finner, NP

## 2023-04-23 NOTE — Patient Instructions (Signed)
  Frankey Shown, thank you for joining Freddy Finner, NP for today's virtual visit.  While this provider is not your primary care provider (PCP), if your PCP is located in our provider database this encounter information will be shared with them immediately following your visit.   A [facility] MyChart account gives you access to today's visit and all your visits, tests, and labs performed at [facility] " click here if you don't have a [facility] MyChart account or go to [email or web address]  Consent: (Patient) Feliz Beam Greenleaf provided verbal consent for this virtual visit at the beginning of the encounter.  Current Medications:  Current Outpatient Medications:    amoxicillin-clavulanate (AUGMENTIN) 875-125 MG tablet, Take 1 tablet by mouth 2 (two) times daily for 7 days., Disp: 14 tablet, Rfl: 0   aspirin EC 81 MG tablet, Take 1 tablet (81 mg total) by mouth daily. Swallow whole., Disp: 30 tablet, Rfl: 11   benazepril (LOTENSIN) 20 MG tablet, TAKE 1 TABLET(20 MG) BY MOUTH DAILY, Disp: 90 tablet, Rfl: 3   desoximetasone (TOPICORT) 0.25 % cream, Apply to affected area bid no more than 2 weeks continuously., Disp: 30 g, Rfl: 3   rosuvastatin (CRESTOR) 20 MG tablet, TAKE 1 TABLET(20 MG) BY MOUTH DAILY, Disp: 90 tablet, Rfl: 3   tadalafil (CIALIS) 20 MG tablet, TAKE 1/2 TO 1 TABLET(10 TO 20 MG) BY MOUTH EVERY OTHER DAY AS NEEDED FOR ERECTILE DYSFUNCTION, Disp: 6 tablet, Rfl: 11   Medications ordered in this encounter:  Meds ordered this encounter  Medications   amoxicillin-clavulanate (AUGMENTIN) 875-125 MG tablet    Sig: Take 1 tablet by mouth 2 (two) times daily for 7 days.    Dispense:  14 tablet    Refill:  0    Supervising Provider:   Merrilee Jansky [[id number]]     *If you need refills on other medications prior to your next appointment, please contact your pharmacy*  Follow-Up: Call back or seek an in-person evaluation if the symptoms worsen or if the condition  fails to improve as anticipated.  [facility] [phone]  Other Instructions   URI recommendations: - Increased rest - Increasing Fluids - Acetaminophen / ibuprofen as needed for fever/pain.  - Salt water gargling, chloraseptic spray and throat lozenges - Mucinex if mucus is present and increasing.  - Saline nasal spray if congestion or if nasal passages feel dry. - Humidifying the air.     If you have been instructed to have an in-person evaluation today at a local Urgent Care facility, please use the link below. It will take you to a list of all of our available [facility], including address, phone number and hours of operation. Please do not delay care.  [facility]  If you or a family member do not have a primary care provider, use the link below to schedule a visit and establish care. When you choose a [facility] primary care physician or advanced practice provider, you gain a long-term partner in health. Find a Primary Care Provider  Learn more about [facility] in-office and virtual care options: [facility] - Get Care Now

## 2023-05-23 ENCOUNTER — Encounter: Payer: 59 | Admitting: Family Medicine

## 2023-05-24 ENCOUNTER — Other Ambulatory Visit: Payer: Self-pay | Admitting: Family Medicine

## 2023-05-25 ENCOUNTER — Encounter: Payer: Self-pay | Admitting: Family Medicine

## 2023-05-25 ENCOUNTER — Ambulatory Visit (INDEPENDENT_AMBULATORY_CARE_PROVIDER_SITE_OTHER): Payer: No Typology Code available for payment source | Admitting: Family Medicine

## 2023-05-25 VITALS — BP 152/88 | HR 92 | Temp 99.2°F | Ht 70.08 in | Wt 159.6 lb

## 2023-05-25 DIAGNOSIS — R748 Abnormal levels of other serum enzymes: Secondary | ICD-10-CM

## 2023-05-25 DIAGNOSIS — Z0000 Encounter for general adult medical examination without abnormal findings: Secondary | ICD-10-CM

## 2023-05-25 LAB — CBC WITH DIFFERENTIAL/PLATELET
Basophils Absolute: 0.1 10*3/uL (ref 0.0–0.1)
Basophils Relative: 1 % (ref 0.0–3.0)
Eosinophils Absolute: 0 10*3/uL (ref 0.0–0.7)
Eosinophils Relative: 0.2 % (ref 0.0–5.0)
HCT: 44.5 % (ref 39.0–52.0)
Hemoglobin: 15.1 g/dL (ref 13.0–17.0)
Lymphocytes Relative: 16.4 % (ref 12.0–46.0)
Lymphs Abs: 1.1 10*3/uL (ref 0.7–4.0)
MCHC: 33.8 g/dL (ref 30.0–36.0)
MCV: 104.8 fL — ABNORMAL HIGH (ref 78.0–100.0)
Monocytes Absolute: 0.6 10*3/uL (ref 0.1–1.0)
Monocytes Relative: 8.5 % (ref 3.0–12.0)
Neutro Abs: 4.9 10*3/uL (ref 1.4–7.7)
Neutrophils Relative %: 73.9 % (ref 43.0–77.0)
Platelets: 237 10*3/uL (ref 150.0–400.0)
RBC: 4.25 Mil/uL (ref 4.22–5.81)
RDW: 12.3 % (ref 11.5–15.5)
WBC: 6.6 10*3/uL (ref 4.0–10.5)

## 2023-05-25 LAB — HEPATIC FUNCTION PANEL
ALT: 180 U/L — ABNORMAL HIGH (ref 0–53)
AST: 174 U/L — ABNORMAL HIGH (ref 0–37)
Albumin: 5.2 g/dL (ref 3.5–5.2)
Alkaline Phosphatase: 63 U/L (ref 39–117)
Bilirubin, Direct: 0.2 mg/dL (ref 0.0–0.3)
Total Bilirubin: 0.9 mg/dL (ref 0.2–1.2)
Total Protein: 7.4 g/dL (ref 6.0–8.3)

## 2023-05-25 LAB — BASIC METABOLIC PANEL
BUN: 14 mg/dL (ref 6–23)
CO2: 26 meq/L (ref 19–32)
Calcium: 9.8 mg/dL (ref 8.4–10.5)
Chloride: 98 meq/L (ref 96–112)
Creatinine, Ser: 0.74 mg/dL (ref 0.40–1.50)
GFR: 109 mL/min (ref >60.00)
Glucose, Bld: 84 mg/dL (ref 70–99)
Potassium: 4.5 meq/L (ref 3.5–5.1)
Sodium: 136 meq/L (ref 135–145)

## 2023-05-25 LAB — LIPID PANEL
Cholesterol: 158 mg/dL (ref 0–200)
HDL: 82.4 mg/dL (ref >39.00)
LDL Cholesterol: 65 mg/dL (ref 0–99)
NonHDL: 75.36
Total CHOL/HDL Ratio: 2
Triglycerides: 50 mg/dL (ref 0.0–149.0)
VLDL: 10 mg/dL (ref 0.0–40.0)

## 2023-05-25 LAB — HEMOGLOBIN A1C: Hgb A1c MFr Bld: 5.4 % (ref 4.6–6.5)

## 2023-05-25 NOTE — Progress Notes (Signed)
Established Patient Office Visit  Subjective   Patient ID: Dave Rodriguez, male    DOB: Jan 30, 1976  Age: 48 y.o. MRN: [id number]  Chief Complaint  Patient presents with   Annual Exam    HPI   [name] is seen for physical exam.  History of hypertension, very high coronary calcium score, hyperlipidemia, ongoing nicotine use, and question of hemochromatosis by previous genetic testing.  However, he saw hematologist last year and apparently may not have hemochromatosis. He is still unfortunately smoking some.  Recent respiratory illness and has had a little bit of lingering cough from that.  Was diagnosis past year with basal cell carcinoma right cheek area and had Mohs excision with [facility] dermatology.  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  Takes benazepril 20 mg daily for hypertension and rosuvastatin 20 mg daily.  Also takes baby aspirin.  We had referred him to cardiology after high coronary calcium score but no other testing was done.  Denies any recent chest pains.  Health maintenance reviewed:  -No history of pneumococcal vaccine.  Does have risk factor of ongoing smoking -Had colonoscopy last year with recommended 10-year follow-up -Tetanus due 2030 -Prior hepatitis C screen negative -Declines flu vaccination  Family history-his father had coronary disease in his late 42s and also history of hypertension and hyperlipidemia.  Maternal grandmother with colon cancer.  No siblings.   Social history-he has 3 children.  One from prior marriage and 2 from this marriage.  Still smokes 1 pack cigarettes per day.  Infrequent alcohol use..  Currently works for [employer] out of [place].  Past Medical History:  Diagnosis Date   Allergy    Elevated LFTs    Hemochromatosis    Hyperlipidemia    Hypertension    Past Surgical History:  Procedure Laterality Date   HERNIA REPAIR Bilateral 2022    reports that he has been smoking  cigarettes. He has a 1.3 pack-year smoking history. He has never used smokeless tobacco. He reports current alcohol use of about 25.0 - 30.0 standard drinks of alcohol per week. He reports that he does not use drugs. family history includes Cancer in his maternal grandmother; Colon cancer (age of onset: 12) in his maternal grandmother; Coronary artery disease in his father and another family member; Depression in his paternal grandfather; Heart disease (age of onset: 55) in his father; Hyperlipidemia in his father; Hypertension in his father and another family member. No Known Allergies   Review of Systems  Constitutional:  Negative for chills, fever, malaise/fatigue and weight loss.  HENT:  Negative for hearing loss.   Eyes:  Negative for blurred vision and double vision.  Respiratory:  Negative for cough and shortness of breath.   Cardiovascular:  Negative for chest pain, palpitations and leg swelling.  Gastrointestinal:  Negative for abdominal pain, blood in stool, constipation and diarrhea.  Genitourinary:  Negative for dysuria.  Skin:  Negative for rash.  Neurological:  Negative for dizziness, speech change, seizures, loss of consciousness and headaches.  Psychiatric/Behavioral:  Negative for depression.       Objective:     BP (!) 152/88 (BP Location: Left Arm, Cuff Size: Normal)   Pulse 92   Temp 99.2 F (37.3 C) (Oral)   Ht 5' 10.08" (1.78 m)   Wt 159 lb 9.6 oz (72.4 kg)   SpO2 97%   BMI 22.85 kg/m  BP Readings from Last 3 Encounters:  05/25/23 (!) 152/88  07/18/22 129/89  06/20/22 (!) 140/90   Wt Readings from Last 3 Encounters:  05/25/23 159 lb 9.6 oz (72.4 kg)  07/18/22 157 lb (71.2 kg)  06/23/22 157 lb (71.2 kg)      Physical Exam Vitals reviewed.  Constitutional:      General: He is not in acute distress.    Appearance: He is well-developed. He is not ill-appearing.  HENT:     Head: Normocephalic and atraumatic.     Right Ear: External ear normal.      Left Ear: External ear normal.  Eyes:     Conjunctiva/sclera: Conjunctivae normal.     Pupils: Pupils are equal, round, and reactive to light.  Neck:     Thyroid: No thyromegaly.  Cardiovascular:     Rate and Rhythm: Normal rate and regular rhythm.     Heart sounds: Normal heart sounds. No murmur heard. Pulmonary:     Effort: No respiratory distress.     Breath sounds: Wheezing present. No rales.     Comments: Does have a few scattered expiratory wheezes.  No retractions.  No rales. Abdominal:     General: Bowel sounds are normal. There is no distension.     Palpations: Abdomen is soft. There is no mass.     Tenderness: There is no abdominal tenderness. There is no guarding or rebound.  Musculoskeletal:     Cervical back: Normal range of motion and neck supple.     Right lower leg: No edema.     Left lower leg: No edema.  Lymphadenopathy:     Cervical: No cervical adenopathy.  Skin:    Findings: No rash.  Neurological:     Mental Status: He is alert and oriented to person, place, and time.     Cranial Nerves: No cranial nerve deficit.      No results found for any visits on 05/25/23.    The 10-year ASCVD risk score (Arnett DK, et al., 2019) is: 3.6%    Assessment & Plan:   Problem List Items Addressed This Visit   None Visit Diagnoses       Physical exam    -  Primary   Relevant Orders   Basic metabolic panel   Lipid panel   CBC with Differential/Platelet   Hepatic function panel   Hemoglobin A74c     48 year old male with ongoing nicotine use, very high coronary calcium score, hyperlipidemia, hypertension.  Initial blood pressure was up today but did come down some after rest.  We recommend close home monitoring and be in touch if consistent greater than 140/90  -Strongly recommend he quit smoking completely -Offered flu vaccine but he declines -Discussed pneumococcal vaccine and recommend he consider because of his risk factor of nicotine use and he will  consider -Colonoscopy up-to-date as above -Obtain follow-up labs as above -Continue low saturated fat diet along with aspirin and rosuvastatin. -Follow-up immediately for any chest pain or increased shortness of breath. -Recommend a goal of 150 minutes minimum of moderate intensity exercise per week -Continue close follow-up with hematology regarding elevated ferritin  No follow-ups on file.    Evelena Peat, MD

## 2023-05-25 NOTE — Patient Instructions (Signed)
Consider pneumovax at some point this year.  Monitor blood pressure and be in touch if BP consistently > 140/90.

## 2023-06-20 NOTE — Addendum Note (Signed)
 Addended by: Christy Sartorius on: 06/20/2023 09:33 AM   Modules accepted: Orders

## 2023-06-23 ENCOUNTER — Other Ambulatory Visit: Payer: Self-pay | Admitting: Family Medicine

## 2023-08-11 ENCOUNTER — Telehealth: Admitting: Nurse Practitioner

## 2023-08-11 DIAGNOSIS — J40 Bronchitis, not specified as acute or chronic: Secondary | ICD-10-CM | POA: Diagnosis not present

## 2023-08-11 MED ORDER — FLUTICASONE PROPIONATE 50 MCG/ACT NA SUSP: 2.0000 | Freq: Every day | NASAL | 6 refills | Status: AC

## 2023-08-11 MED ORDER — AZITHROMYCIN 250 MG PO TABS: ORAL_TABLET | ORAL | 0 refills | Status: AC

## 2023-08-11 NOTE — Progress Notes (Signed)
 Virtual Visit Consent   Dave Rodriguez, you are scheduled for a virtual visit with a [facility] provider today. Just as with appointments in the office, your consent must be obtained to participate. Your consent will be active for this visit and any virtual visit you may have with one of our providers in the next 365 days. If you have a MyChart account, a copy of this consent can be sent to you electronically.  As this is a virtual visit, video technology does not allow for your provider to perform a traditional examination. This may limit your provider's ability to fully assess your condition. If your provider identifies any concerns that need to be evaluated in person or the need to arrange testing (such as labs, EKG, etc.), we will make arrangements to do so. Although advances in technology are sophisticated, we cannot ensure that it will always work on either your end or our end. If the connection with a video visit is poor, the visit may have to be switched to a telephone visit. With either a video or telephone visit, we are not always able to ensure that we have a secure connection.  By engaging in this virtual visit, you consent to the provision of healthcare and authorize for your insurance to be billed (if applicable) for the services provided during this visit. Depending on your insurance coverage, you may receive a charge related to this service.  I need to obtain your verbal consent now. Are you willing to proceed with your visit today? [name] has provided verbal consent on 08/11/2023 for a virtual visit (video or telephone). [name], NP  Date: 08/11/2023 1:07 PM   Virtual Visit via Video Note  f I, [name], connected with  Dave Rodriguez  ([id number], 13-Aug-1975) on 08/11/23 at  1:00 PM EDT by a video-enabled telemedicine application and verified that I am speaking with the correct person using two identifiers.  Location: Patient: Virtual Visit Location  Patient: Home Provider: Virtual Visit Location Provider: Home Office   I discussed the limitations of evaluation and management by telemedicine and the availability of in person appointments. The patient expressed understanding and agreed to proceed.    History of Present Illness: Dave Rodriguez is a 48 y.o. who identifies as a male who was assigned male at birth, and is being seen today for URI with cough and congestion.  Over the paste several days [name] has been experiencing sore throat, nasal and chest congestion with productive cough of thick yellow mucus.  Symptoms have been worsening despite over-the-counter therapy.  He has been taking Allegra and Advil with little relief of symptoms.  Denies fever, shortness of breath or wheezing.   Was treated with Augmentin  in December for acute bacterial sinusitis in December and is requesting to have this prescribed again today however I have explained to him that azithromycin  would be the standard of care for any suspected respiratory infection.  Problems:  Patient Active Problem List   Diagnosis Date Noted   High coronary artery calcium  score 07/26/2021   Psoriasis 08/11/2016   ETOH abuse 07/18/2013   Hemochromatosis 03/06/2013   Tobacco use disorder 02/26/2013   Hyperlipidemia 04/12/2007   Essential hypertension 04/12/2007    Allergies: No Known Allergies Medications:  Current Outpatient Medications:    azithromycin  (ZITHROMAX ) 250 MG tablet, Take 2 tablets on day 1, then 1 tablet daily on days 2 through 5, Disp: 6 tablet, Rfl: 0   fluticasone  (  FLONASE ) 50 MCG/ACT nasal spray, Place 2 sprays into both nostrils daily., Disp: 16 g, Rfl: 6   aspirin  EC 81 MG tablet, Take 1 tablet (81 mg total) by mouth daily. Swallow whole., Disp: 30 tablet, Rfl: 11   benazepril  (LOTENSIN ) 20 MG tablet, TAKE 1 TABLET(20 MG) BY MOUTH DAILY, Disp: 30 tablet, Rfl: 5   desoximetasone  (TOPICORT ) 0.25 % cream, Apply to affected area bid no more than 2 weeks  continuously., Disp: 30 g, Rfl: 3   rosuvastatin  (CRESTOR ) 20 MG tablet, TAKE 1 TABLET(20 MG) BY MOUTH DAILY, Disp: 30 tablet, Rfl: 5   tadalafil  (CIALIS ) 20 MG tablet, TAKE 1/2 TO 1 TABLET(10 TO 20 MG) BY MOUTH EVERY OTHER DAY AS NEEDED FOR ERECTILE DYSFUNCTION, Disp: 6 tablet, Rfl: 11  Observations/Objective: Patient is well-developed, well-nourished in no acute distress.  Resting comfortably at home.  Head is normocephalic, atraumatic.  No labored breathing.  Speech is clear and coherent with logical content.  Patient is alert and oriented at baseline.    Assessment and Plan: 1. Bronchitis (Primary) - azithromycin  (ZITHROMAX ) 250 MG tablet; Take 2 tablets on day 1, then 1 tablet daily on days 2 through 5  Dispense: 6 tablet; Refill: 0 - fluticasone  (FLONASE ) 50 MCG/ACT nasal spray; Place 2 sprays into both nostrils daily.  Dispense: 16 g; Refill: 6 INSTRUCTIONS: use a humidifier for nasal congestion Drink plenty of fluids, rest and wash hands frequently to avoid the spread of infection Alternate tylenol and Motrin for relief of fever   Follow Up Instructions: I discussed the assessment and treatment plan with the patient. The patient was provided an opportunity to ask questions and all were answered. The patient agreed with the plan and demonstrated an understanding of the instructions.  A copy of instructions were sent to the patient via MyChart unless otherwise noted below.     The patient was advised to call back or seek an in-person evaluation if the symptoms worsen or if the condition fails to improve as anticipated.    [name], NP.

## 2023-08-11 NOTE — Patient Instructions (Signed)
  Lennie Ra, thank you for joining [name], NP for today's virtual visit.  While this provider is not your primary care provider (PCP), if your PCP is located in our provider database this encounter information will be shared with them immediately following your visit.   A [facility] MyChart account gives you access to today's visit and all your visits, tests, and labs performed at [facility] " click here if you don't have a [facility] MyChart account or go to [email or web address]  Consent: (Patient) Dave Rodriguez provided verbal consent for this virtual visit at the beginning of the encounter.  Current Medications:  Current Outpatient Medications:    azithromycin  (ZITHROMAX ) 250 MG tablet, Take 2 tablets on day 1, then 1 tablet daily on days 2 through 5, Disp: 6 tablet, Rfl: 0   fluticasone  (FLONASE ) 50 MCG/ACT nasal spray, Place 2 sprays into both nostrils daily., Disp: 16 g, Rfl: 6   aspirin  EC 81 MG tablet, Take 1 tablet (81 mg total) by mouth daily. Swallow whole., Disp: 30 tablet, Rfl: 11   benazepril  (LOTENSIN ) 20 MG tablet, TAKE 1 TABLET(20 MG) BY MOUTH DAILY, Disp: 30 tablet, Rfl: 5   desoximetasone  (TOPICORT ) 0.25 % cream, Apply to affected area bid no more than 2 weeks continuously., Disp: 30 g, Rfl: 3   rosuvastatin  (CRESTOR ) 20 MG tablet, TAKE 1 TABLET(20 MG) BY MOUTH DAILY, Disp: 30 tablet, Rfl: 5   tadalafil  (CIALIS ) 20 MG tablet, TAKE 1/2 TO 1 TABLET(10 TO 20 MG) BY MOUTH EVERY OTHER DAY AS NEEDED FOR ERECTILE DYSFUNCTION, Disp: 6 tablet, Rfl: 11   Medications ordered in this encounter:  Meds ordered this encounter  Medications   azithromycin  (ZITHROMAX ) 250 MG tablet    Sig: Take 2 tablets on day 1, then 1 tablet daily on days 2 through 5    Dispense:  6 tablet    Refill:  0    Supervising Provider:   LAMPTEY, PHILIP O [[id number]]   fluticasone  (FLONASE ) 50 MCG/ACT nasal spray    Sig: Place 2 sprays into both nostrils daily.    Dispense:   16 g    Refill:  6    Supervising Provider:   Corine Dice [[id number]]     *If you need refills on other medications prior to your next appointment, please contact your pharmacy*  Follow-Up: Call back or seek an in-person evaluation if the symptoms worsen or if the condition fails to improve as anticipated.  [facility] [phone]  Other Instructions INSTRUCTIONS: use a humidifier for nasal congestion Drink plenty of fluids, rest and wash hands frequently to avoid the spread of infection Alternate tylenol and Motrin for relief of fever    If you have been instructed to have an in-person evaluation today at a local Urgent Care facility, please use the link below. It will take you to a list of all of our available [facility], including address, phone number and hours of operation. Please do not delay care.  [facility]  If you or a family member do not have a primary care provider, use the link below to schedule a visit and establish care. When you choose a [facility] primary care physician or advanced practice provider, you gain a long-term partner in health. Find a Primary Care Provider  Learn more about [facility] in-office and virtual care options:  - Get Care Now

## 2023-12-28 ENCOUNTER — Other Ambulatory Visit: Payer: Self-pay | Admitting: Family Medicine
